# Patient Record
Sex: Female | Born: 1960 | Race: White | Hispanic: No | Marital: Married | Smoking: Current some day smoker
Health system: Southern US, Community
[De-identification: ages and names within clinical notes are randomized; demographics above are authoritative.]

## PROBLEM LIST (undated history)

## (undated) DIAGNOSIS — K219 Gastro-esophageal reflux disease without esophagitis: Secondary | ICD-10-CM

## (undated) DIAGNOSIS — M1990 Unspecified osteoarthritis, unspecified site: Secondary | ICD-10-CM

## (undated) DIAGNOSIS — I10 Essential (primary) hypertension: Secondary | ICD-10-CM

## (undated) DIAGNOSIS — F419 Anxiety disorder, unspecified: Secondary | ICD-10-CM

## (undated) HISTORY — PX: URETHRAL DILATION: SUR417

---

## 2002-03-27 ENCOUNTER — Ambulatory Visit (HOSPITAL_COMMUNITY): Admission: RE | Admit: 2002-03-27 | Discharge: 2002-03-27 | Payer: Self-pay | Admitting: Family Medicine

## 2004-02-21 ENCOUNTER — Ambulatory Visit (HOSPITAL_COMMUNITY): Admission: RE | Admit: 2004-02-21 | Discharge: 2004-02-21 | Payer: Self-pay | Admitting: Obstetrics and Gynecology

## 2005-02-11 ENCOUNTER — Encounter: Admission: RE | Admit: 2005-02-11 | Discharge: 2005-03-19 | Payer: Self-pay | Admitting: *Deleted

## 2005-05-11 ENCOUNTER — Ambulatory Visit (HOSPITAL_COMMUNITY): Admission: RE | Admit: 2005-05-11 | Discharge: 2005-05-11 | Payer: Self-pay | Admitting: *Deleted

## 2006-08-23 ENCOUNTER — Ambulatory Visit (HOSPITAL_COMMUNITY): Admission: RE | Admit: 2006-08-23 | Discharge: 2006-08-23 | Payer: Self-pay | Admitting: Family Medicine

## 2012-10-27 ENCOUNTER — Emergency Department (HOSPITAL_COMMUNITY): Payer: Self-pay

## 2012-10-27 ENCOUNTER — Emergency Department (HOSPITAL_COMMUNITY)
Admission: EM | Admit: 2012-10-27 | Discharge: 2012-10-27 | Disposition: A | Discharge status: Home / Self Care | Payer: Self-pay | Attending: Emergency Medicine | Admitting: Emergency Medicine

## 2012-10-27 ENCOUNTER — Encounter (HOSPITAL_COMMUNITY): Payer: Self-pay

## 2012-10-27 DIAGNOSIS — F17200 Nicotine dependence, unspecified, uncomplicated: Secondary | ICD-10-CM | POA: Insufficient documentation

## 2012-10-27 DIAGNOSIS — I10 Essential (primary) hypertension: Secondary | ICD-10-CM | POA: Insufficient documentation

## 2012-10-27 DIAGNOSIS — M7989 Other specified soft tissue disorders: Secondary | ICD-10-CM | POA: Insufficient documentation

## 2012-10-27 DIAGNOSIS — L02419 Cutaneous abscess of limb, unspecified: Secondary | ICD-10-CM | POA: Insufficient documentation

## 2012-10-27 DIAGNOSIS — Z79899 Other long term (current) drug therapy: Secondary | ICD-10-CM | POA: Insufficient documentation

## 2012-10-27 DIAGNOSIS — L0390 Cellulitis, unspecified: Secondary | ICD-10-CM

## 2012-10-27 DIAGNOSIS — Z87828 Personal history of other (healed) physical injury and trauma: Secondary | ICD-10-CM | POA: Insufficient documentation

## 2012-10-27 HISTORY — DX: Essential (primary) hypertension: I10

## 2012-10-27 MED ORDER — TRIAMCINOLONE ACETONIDE 0.1 % EX CREA: TOPICAL_CREAM | Freq: Two times a day (BID) | CUTANEOUS | Status: DC

## 2012-10-27 MED ORDER — SULFAMETHOXAZOLE-TRIMETHOPRIM 800-160 MG PO TABS: 1.0000 | ORAL_TABLET | Freq: Two times a day (BID) | ORAL | Status: DC

## 2012-10-27 NOTE — ED Notes (Signed)
Pt reports fell 6 weeks ago and hurt left foot.  Pt says since then has had some redness in lower leg.  Pt says now left foot, ankle, and lower leg red and itches.  Pedal pulse present.

## 2012-10-27 NOTE — Progress Notes (Signed)
ED/CM noted patient did not have health insurance and/or PCP listed in the computer.  Patient was given the [place] resource handout with information on the clinics, food pantries, and the handout for new health insurance sign-up.  Patient expressed appreciation for this. 

## 2012-10-27 NOTE — ED Notes (Signed)
Pt reports falling 6 weeks ago and injured left ankle, cont. To have swelling and pain, noted some streaking up her leg several days ago, looked online and she thinks she may have a blood clot. She denies any fever, drainage from the area. Denies any cough or congestion, no sob.

## 2012-10-27 NOTE — ED Provider Notes (Signed)
CSN: [id number]     Arrival date & time 10/27/12  1047 History  This chart was scribed for Caitlin Hutching, MD by Quintella Reichert, ED scribe.  This patient was seen in room APA01/APA01 and the patient's care was started at 12:07 PM.  Chief Complaint  Patient presents with  . Ankle Pain  . Leg Swelling    The history is provided by the patient. No language interpreter was used.    HPI Comments: EZELLA Bond is a 52 y.o. female with h/o HTN who presents to the Emergency Department complaining of left foot injury with subsequent progressively-worsening pain, swelling and redness.  Pt states that she fell 6 weeks ago with impact to the left foot.  She did not break her skin.  Since then she has had some moderate redness and pain to the lower left leg.  She was seen in the UC 2 weeks ago and placed on antibiotics.  She states that since then her redness and pain have persisted and her swelling has grown more severe.  Swelling worsens throughout the day and when she stands on her feet.  Pt also states the area has been hot today.    Past Medical History  Diagnosis Date  . Hypertension     History reviewed. No pertinent past surgical history.   No family history on file.   History  Substance Use Topics  . Smoking status: Current Every Day Smoker  . Smokeless tobacco: Not on file  . Alcohol Use: Yes     Comment: occ    OB History   Grav Para Term Preterm Abortions TAB SAB Ect Mult Living                   Review of Systems A complete 10 system review of systems was obtained and all systems are negative except as noted in the HPI and PMH.    Allergies  Haldol  Home Medications   Current Outpatient Rx  Name  Route  Sig  Dispense  Refill  . busPIRone (BUSPAR) 10 MG tablet   Oral   Take 10 mg by mouth 2 (two) times daily.         . metoprolol succinate (TOPROL-XL) 25 MG 24 hr tablet   Oral   Take 25 mg by mouth daily.          BP 181/85  Pulse 91  Temp(Src) 98  F (36.7 C) (Oral)  Resp 18  Ht 5\' 7"  (1.702 m)  Wt 243 lb (110.224 kg)  BMI 38.05 kg/m2  SpO2 100%  Physical Exam  Nursing note and vitals reviewed. Constitutional: She is oriented to person, place, and time. She appears well-developed and well-nourished.  HENT:  Head: Normocephalic and atraumatic.  Eyes: Conjunctivae and EOM are normal. Pupils are equal, round, and reactive to light.  Neck: Normal range of motion. Neck supple.  Cardiovascular: Normal rate, regular rhythm and normal heart sounds.   Pulmonary/Chest: Effort normal and breath sounds normal.  Abdominal: Soft. Bowel sounds are normal.  Musculoskeletal: Normal range of motion.  Anterior aspect of distal tibia and dorsum of foot slightly erythematous.  Discomfort with flexion of ankle.  Neurological: She is alert and oriented to person, place, and time.  Skin: Skin is warm and dry.  Psychiatric: She has a normal mood and affect.    ED Course  Procedures (including critical care time)  DIAGNOSTIC STUDIES: Oxygen Saturation is 100% on room air, normal by my interpretation.  COORDINATION OF CARE: 12:13 PM-Discussed treatment plan which includes [place] to rule out blood clot and antibiotics if negative with pt at bedside and pt agreed to plan.    Labs Review Labs Reviewed - No data to display  Imaging Review No results found. US Venous Img Lower Unilateral Left  10/27/2012   CLINICAL DATA:  Lower extremity edema  EXAM: VENOUS DUPLEX ULTRASOUND OF LEFT LOWER EXTREMITY  TECHNIQUE: Gray-scale sonography with graded compression, as well as color Doppler and duplex ultrasound, were performed to evaluate the deep venous system from the level of the common femoral vein through the popliteal and proximal calf veins. Spectral Doppler was utilized to evaluate flow at rest and with distal augmentation maneuvers.  COMPARISON:  None.  FINDINGS: Flow in the venous structures of the left lower extremity is spontaneous and phasic in all  segments. There is normal compression and augmentation throughout the venous segments of the left lower extremity. Venous Doppler signal is normal in all regions. There is no thrombus in the deep or visualized superficial venous structures on the left. There is no left lower extremity deep venous incompetence. There is soft tissue edema below the knee.  IMPRESSION: No deep venous thrombosis in the left lower extremity. Edema in the lower leg region.   Electronically Signed   By: Bretta Bang   On: 10/27/2012 13:17   MDM  No diagnosis found. Ultrasound of left lower extremity shows no DVT.    Rx Septra DS twice a day for cellulitis  and Aristocort 0.1% ointment    I personally performed the services described in this documentation, which was scribed in my presence. The recorded information has been reviewed and is accurate.    Caitlin Hutching, MD 10/27/12 1400

## 2012-10-28 NOTE — Care Management ED Note (Signed)
       CARE MANAGEMENT ED NOTE 10/27/2012  Patient:  Caitlin Bond, Caitlin Bond   Account Number:  [id number]  Date Initiated:  10/27/2012  Documentation initiated by:  Anibal Henderson  Subjective/Objective Assessment:     Subjective/Objective Assessment Detail:   ED/CM noted patient did not have health insurance and/or PCP listed in the computer.  Patient was given the [facility] with information on the clinics, food pantries, and the handout for new health insurance sign-up.  Patient expressed appreciation for this.     Action/Plan:   Action/Plan Detail:   Anticipated DC Date:  10/27/2012     Status Recommendation to Physician:   Result of Recommendation:        Choice offered to / List presented to:            Status of service:  Completed, signed off  ED Comments:   ED Comments Detail:

## 2013-06-06 ENCOUNTER — Other Ambulatory Visit (HOSPITAL_COMMUNITY): Payer: Self-pay | Admitting: Family Medicine

## 2013-06-06 DIAGNOSIS — Z139 Encounter for screening, unspecified: Secondary | ICD-10-CM

## 2013-06-15 ENCOUNTER — Ambulatory Visit (HOSPITAL_COMMUNITY): Payer: Self-pay

## 2013-07-04 ENCOUNTER — Ambulatory Visit (HOSPITAL_COMMUNITY)
Admission: RE | Admit: 2013-07-04 | Discharge: 2013-07-04 | Disposition: A | Discharge status: Home / Self Care | Payer: Self-pay | Source: Ambulatory Visit | Attending: Family Medicine | Admitting: Family Medicine

## 2013-07-04 DIAGNOSIS — Z139 Encounter for screening, unspecified: Secondary | ICD-10-CM

## 2013-07-11 ENCOUNTER — Encounter: Payer: Self-pay | Admitting: *Deleted

## 2013-07-17 ENCOUNTER — Telehealth: Payer: Self-pay | Admitting: General Practice

## 2013-07-17 NOTE — Telephone Encounter (Signed)
Message copied by Idamae Schuller on Mon Jul 17, 2013 12:22 PM ------      Message from: SIMS, CHELSEY R      Created: Mon Jul 17, 2013  9:34 AM       Pt called stating she has went through a mess trying to get her [place] forms and hardship settlement application done to help pay for her medical bills, [facility] told her they did not have anything on file for her forms, pt has a appointment 08/14/13 at 11 as new pt with Vicente Males. Pt wants to know if her forms are not ready before this appointment will her assistance forms pay after the visit, pt said she has to pay out of pocket now. Please advise [phone]. I did make pt aware that we do not turn pt's away with no co-pays that billing will just bill her the visit, Pt is concerned if she comes and her forms are not ready will they cover it after the appointment, pt said she had to send bank statements and all kinds of forms for this twice and [facility] does not have it on file so she has to go through the hold process again.  ------

## 2013-07-17 NOTE — Telephone Encounter (Signed)
I spoke with the patient and made her aware that she needed to bring in $25 on the day of her ov.  She will also bring in the appropriate paperwork to complete her financial assistance application.

## 2013-08-14 ENCOUNTER — Encounter (INDEPENDENT_AMBULATORY_CARE_PROVIDER_SITE_OTHER): Payer: Self-pay

## 2013-08-14 ENCOUNTER — Encounter: Payer: Self-pay | Admitting: Gastroenterology

## 2013-08-14 ENCOUNTER — Ambulatory Visit (INDEPENDENT_AMBULATORY_CARE_PROVIDER_SITE_OTHER): Payer: Self-pay | Admitting: Gastroenterology

## 2013-08-14 VITALS — BP 130/85 | HR 70 | Temp 98.0°F | Resp 18 | Ht 67.0 in | Wt 246.0 lb

## 2013-08-14 DIAGNOSIS — R195 Other fecal abnormalities: Secondary | ICD-10-CM

## 2013-08-14 NOTE — Assessment & Plan Note (Signed)
53 year old female with heme positive stool but no concerning lower or upper GI symptoms. No FH of colorectal cancer. No prior colonoscopy; need initial screening colonoscopy. Source of heme positive stool likely benign in nature.  Proceed with TCS with Dr. Gala Romney in near future: the risks, benefits, and alternatives have been discussed with the patient in detail. The patient states understanding and desires to proceed.

## 2013-08-14 NOTE — Patient Instructions (Signed)
We have set you up for a colonoscopy with Dr. Gala Romney in the near future.  Further recommendations to follow!

## 2013-08-14 NOTE — Progress Notes (Signed)
      Primary Care Physician:  Yevette Edwards, NP Primary Gastroenterologist:  Dr. Gala Romney   Chief Complaint  Patient presents with  . Referral    HPI:   Caitlin Bond presents today at the request of Yevette Edwards, NP, secondary to heme positive stool. No prior colonoscopy. No evidence of hematochezia. No melena.  BM every morning. No abdominal pain. No N/V. Occasional reflux controlled by OTC agents. No FH of colon cancer. Outside labs in April 2015 with normal Hgb/Hct.   Past Medical History  Diagnosis Date  . Hypertension     Past Surgical History  Procedure Laterality Date  . Urethral dilation      as child    Current Outpatient Prescriptions  Medication Sig Dispense Refill  . losartan-hydrochlorothiazide (HYZAAR) 50-12.5 MG per tablet Take 1 tablet by mouth daily.      . metoprolol succinate (TOPROL-XL) 25 MG 24 hr tablet Take 25 mg by mouth daily.       No current facility-administered medications for this visit.    Allergies as of 08/14/2013 - Review Complete 08/14/2013  Allergen Reaction Noted  . Haldol [haloperidol lactate]  10/27/2012    Family History  Problem Relation Age of Onset  . Colon cancer Neg Hx     History   Social History  . Marital Status: Married    Spouse Name: N/A    Number of Children: N/A  . Years of Education: N/A   Occupational History  . Not on file.   Social History Main Topics  . Smoking status: Current Every Day Smoker -- 0.75 packs/day    Types: Cigarettes  . Smokeless tobacco: Not on file  . Alcohol Use: Yes     Comment: occ  . Drug Use: No  . Sexual Activity: Not on file   Other Topics Concern  . Not on file   Social History Narrative  . No narrative on file    Review of Systems: Gen: see HPI CV: Denies chest pain, heart palpitations, peripheral edema, syncope.  Resp: Denies shortness of breath at rest or with exertion. Denies wheezing or cough.  GI: see HPI GU : Denies urinary burning, urinary frequency,  urinary hesitancy MS: right knee pain  Derm: Denies rash, itching, dry skin Psych: Denies depression, anxiety, memory loss, and confusion Heme: Denies bruising, bleeding, and enlarged lymph nodes.  Physical Exam: BP 130/85  Pulse 70  Temp(Src) 98 F (36.7 C) (Oral)  Resp 18  Ht 5\' 7"  (1.702 m)  Wt 246 lb (111.585 kg)  BMI 38.52 kg/m2 General:   Alert and oriented. Pleasant and cooperative. Well-nourished and well-developed.  Head:  Normocephalic and atraumatic. Eyes:  Without icterus, sclera clear and conjunctiva pink.  Ears:  Normal auditory acuity. Nose:  No deformity, discharge,  or lesions. Mouth:  No deformity or lesions, oral mucosa pink.  Neck:  Supple, without mass or thyromegaly. Lungs:  Clear to auscultation bilaterally. No wheezes, rales, or rhonchi. No distress.  Heart:  S1, S2 present without murmurs appreciated.  Abdomen:  +BS, soft, non-tender and non-distended. No HSM noted. No guarding or rebound. No masses appreciated.  Rectal:  Deferred  Msk:  Symmetrical without gross deformities. Normal posture. Extremities:  Without clubbing or edema. Neurologic:  Alert and  oriented x4;  grossly normal neurologically. Skin:  Intact without significant lesions or rashes. Cervical Nodes:  No significant cervical adenopathy. Psych:  Alert and cooperative. Normal mood and affect.

## 2013-08-15 NOTE — Progress Notes (Signed)
cc'd to pcp 

## 2013-08-24 ENCOUNTER — Encounter (HOSPITAL_COMMUNITY): Payer: Self-pay | Admitting: Pharmacy Technician

## 2013-09-12 ENCOUNTER — Encounter (HOSPITAL_COMMUNITY)
Admission: RE | Disposition: A | Discharge status: Home / Self Care | Payer: Self-pay | Source: Ambulatory Visit | Attending: Internal Medicine

## 2013-09-12 ENCOUNTER — Encounter (HOSPITAL_COMMUNITY): Payer: Self-pay | Admitting: *Deleted

## 2013-09-12 ENCOUNTER — Ambulatory Visit (HOSPITAL_COMMUNITY)
Admission: RE | Admit: 2013-09-12 | Discharge: 2013-09-12 | Disposition: A | Discharge status: Home / Self Care | Payer: Self-pay | Source: Ambulatory Visit | Attending: Internal Medicine | Admitting: Internal Medicine

## 2013-09-12 DIAGNOSIS — Z86010 Personal history of colonic polyps: Secondary | ICD-10-CM

## 2013-09-12 DIAGNOSIS — D129 Benign neoplasm of anus and anal canal: Secondary | ICD-10-CM

## 2013-09-12 DIAGNOSIS — F17200 Nicotine dependence, unspecified, uncomplicated: Secondary | ICD-10-CM | POA: Insufficient documentation

## 2013-09-12 DIAGNOSIS — I10 Essential (primary) hypertension: Secondary | ICD-10-CM | POA: Insufficient documentation

## 2013-09-12 DIAGNOSIS — D126 Benign neoplasm of colon, unspecified: Secondary | ICD-10-CM | POA: Insufficient documentation

## 2013-09-12 DIAGNOSIS — R195 Other fecal abnormalities: Secondary | ICD-10-CM | POA: Insufficient documentation

## 2013-09-12 DIAGNOSIS — D128 Benign neoplasm of rectum: Secondary | ICD-10-CM | POA: Insufficient documentation

## 2013-09-12 HISTORY — PX: COLONOSCOPY: SHX5424

## 2013-09-12 SURGERY — COLONOSCOPY
Anesthesia: Moderate Sedation

## 2013-09-12 MED ORDER — MEPERIDINE HCL 100 MG/ML IJ SOLN
INTRAMUSCULAR | Status: AC
  Filled 2013-09-12: qty 2

## 2013-09-12 MED ORDER — MIDAZOLAM HCL 5 MG/5ML IJ SOLN
INTRAMUSCULAR | Status: DC | PRN
  Administered 2013-09-12 (×2): 2 mg via INTRAVENOUS
  Administered 2013-09-12: 1 mg via INTRAVENOUS
  Administered 2013-09-12 (×2): 2 mg via INTRAVENOUS

## 2013-09-12 MED ORDER — SODIUM CHLORIDE 0.9 % IV SOLN
INTRAVENOUS | Status: DC
  Administered 2013-09-12: 09:00:00 via INTRAVENOUS

## 2013-09-12 MED ORDER — ONDANSETRON HCL 4 MG/2ML IJ SOLN
INTRAMUSCULAR | Status: DC | PRN
  Administered 2013-09-12: 4 mg via INTRAVENOUS

## 2013-09-12 MED ORDER — ONDANSETRON HCL 4 MG/2ML IJ SOLN
INTRAMUSCULAR | Status: AC
  Filled 2013-09-12: qty 2

## 2013-09-12 MED ORDER — MIDAZOLAM HCL 5 MG/5ML IJ SOLN
INTRAMUSCULAR | Status: AC
  Filled 2013-09-12: qty 10

## 2013-09-12 MED ORDER — MEPERIDINE HCL 100 MG/ML IJ SOLN
INTRAMUSCULAR | Status: DC | PRN
  Administered 2013-09-12 (×3): 50 mg via INTRAVENOUS
  Administered 2013-09-12 (×2): 25 mg via INTRAVENOUS

## 2013-09-12 MED ORDER — STERILE WATER FOR IRRIGATION IR SOLN
Status: DC | PRN
  Administered 2013-09-12: 10:00:00

## 2013-09-12 NOTE — Interval H&P Note (Signed)
History and Physical Interval Note:  09/12/2013 10:02 AM  Caitlin Bond  has presented today for surgery, with the diagnosis of HEME POSITIVE STOOL  The various methods of treatment have been discussed with the patient and family. After consideration of risks, benefits and other options for treatment, the patient has consented to  Procedure(s) with comments: COLONOSCOPY (N/A) - 9:45 as a surgical intervention .  The patient's history has been reviewed, patient examined, no change in status, stable for surgery.  I have reviewed the patient's chart and labs.  Questions were answered to the patient's satisfaction.     Nikos Anglemyer  No change. Colonoscopy per plan.The risks, benefits, limitations, alternatives and imponderables have been reviewed with the patient. Questions have been answered. All parties are agreeable.

## 2013-09-12 NOTE — H&P (View-Only) (Signed)
      Primary Care Physician:  Yevette Edwards, NP Primary Gastroenterologist:  Dr. Gala Romney   Chief Complaint  Patient presents with  . Referral    HPI:   Caitlin Bond presents today at the request of Yevette Edwards, NP, secondary to heme positive stool. No prior colonoscopy. No evidence of hematochezia. No melena.  BM every morning. No abdominal pain. No N/V. Occasional reflux controlled by OTC agents. No FH of colon cancer. Outside labs in April 2015 with normal Hgb/Hct.   Past Medical History  Diagnosis Date  . Hypertension     Past Surgical History  Procedure Laterality Date  . Urethral dilation      as child    Current Outpatient Prescriptions  Medication Sig Dispense Refill  . losartan-hydrochlorothiazide (HYZAAR) 50-12.5 MG per tablet Take 1 tablet by mouth daily.      . metoprolol succinate (TOPROL-XL) 25 MG 24 hr tablet Take 25 mg by mouth daily.       No current facility-administered medications for this visit.    Allergies as of 08/14/2013 - Review Complete 08/14/2013  Allergen Reaction Noted  . Haldol [haloperidol lactate]  10/27/2012    Family History  Problem Relation Age of Onset  . Colon cancer Neg Hx     History   Social History  . Marital Status: Married    Spouse Name: N/A    Number of Children: N/A  . Years of Education: N/A   Occupational History  . Not on file.   Social History Main Topics  . Smoking status: Current Every Day Smoker -- 0.75 packs/day    Types: Cigarettes  . Smokeless tobacco: Not on file  . Alcohol Use: Yes     Comment: occ  . Drug Use: No  . Sexual Activity: Not on file   Other Topics Concern  . Not on file   Social History Narrative  . No narrative on file    Review of Systems: Gen: see HPI CV: Denies chest pain, heart palpitations, peripheral edema, syncope.  Resp: Denies shortness of breath at rest or with exertion. Denies wheezing or cough.  GI: see HPI GU : Denies urinary burning, urinary frequency,  urinary hesitancy MS: right knee pain  Derm: Denies rash, itching, dry skin Psych: Denies depression, anxiety, memory loss, and confusion Heme: Denies bruising, bleeding, and enlarged lymph nodes.  Physical Exam: BP 130/85  Pulse 70  Temp(Src) 98 F (36.7 C) (Oral)  Resp 18  Ht 5\' 7"  (1.702 m)  Wt 246 lb (111.585 kg)  BMI 38.52 kg/m2 General:   Alert and oriented. Pleasant and cooperative. Well-nourished and well-developed.  Head:  Normocephalic and atraumatic. Eyes:  Without icterus, sclera clear and conjunctiva pink.  Ears:  Normal auditory acuity. Nose:  No deformity, discharge,  or lesions. Mouth:  No deformity or lesions, oral mucosa pink.  Neck:  Supple, without mass or thyromegaly. Lungs:  Clear to auscultation bilaterally. No wheezes, rales, or rhonchi. No distress.  Heart:  S1, S2 present without murmurs appreciated.  Abdomen:  +BS, soft, non-tender and non-distended. No HSM noted. No guarding or rebound. No masses appreciated.  Rectal:  Deferred  Msk:  Symmetrical without gross deformities. Normal posture. Extremities:  Without clubbing or edema. Neurologic:  Alert and  oriented x4;  grossly normal neurologically. Skin:  Intact without significant lesions or rashes. Cervical Nodes:  No significant cervical adenopathy. Psych:  Alert and cooperative. Normal mood and affect.

## 2013-09-12 NOTE — Op Note (Signed)
[facility] [address] [place], [address]   COLONOSCOPY PROCEDURE REPORT  PATIENT: [name], [name]  MR#:         [id number] BIRTHDATE: 15-Nov-1960 , 56  yrs. old GENDER: Female ENDOSCOPIST: R.  Garfield Cornea, MD FACP FACG REFERRED BY:  Yevette Edwards, NP PROCEDURE DATE:  09/12/2013 PROCEDURE:     Ileocolonoscopy with snare polypectomies  INDICATIONS: occult blood positive stool  INFORMED CONSENT:  The risks, benefits, alternatives and imponderables including but not limited to bleeding, perforation as well as the possibility of a missed lesion have been reviewed.  The potential for biopsy, lesion removal, etc. have also been discussed.  Questions have been answered.  All parties agreeable. Please see the history and physical in the medical record for more information.  MEDICATIONS: Versed 9 mg IV and Demerol 200 mg IV in divided doses. Zofran 4 mg IV.  DESCRIPTION OF PROCEDURE:  After a digital rectal exam was performed, the EC-[id number] ([id number])  colonoscope was advanced from the anus through the rectum and colon to the area of the cecum, ileocecal valve and appendiceal orifice.  The cecum was deeply intubated.  These structures were well-seen and photographed for the record.  From the level of the cecum and ileocecal valve, the scope was slowly and cautiously withdrawn.  The mucosal surfaces were carefully surveyed utilizing scope tip deflection to facilitate fold flattening as needed.  The scope was pulled down into the rectum where a thorough examination including retroflexion was performed.    FINDINGS:  Adequate preparation. Normal rectum. (1) 6 mm polyp in the mid sigmoid segment and (1) 4 mm polyp at the splenic flexure; otherwise, the remainder of the colonic mucosa appeared normal. The distal 5 cm of terminal ileal mucosa also appeared normal.  THERAPEUTIC / DIAGNOSTIC MANEUVERS PERFORMED:  The above-mentioned polyps were hot and cold snare  removed, respectively.  COMPLICATIONS: none  CECAL WITHDRAWAL TIME:  9 minutes  IMPRESSION:  Colonic polyps-removed as described above.  RECOMMENDATIONS: Followup on pathology.   _______________________________ eSigned:  R. Garfield Cornea, MD FACP [facility] 09/12/2013 10:52 AM   CC:    PATIENT NAME:  Caitlin Bond, Caitlin Bond MR#: [id number]

## 2013-09-12 NOTE — Discharge Instructions (Addendum)
°Colonoscopy °Discharge Instructions ° °Read the instructions outlined below and refer to this sheet in the next few weeks. These discharge instructions provide you with general information on caring for yourself after you leave the hospital. Your doctor may also give you specific instructions. While your treatment has been planned according to the most current medical practices available, unavoidable complications occasionally occur. If you have any problems or questions after discharge, call Dr. Rourk at [phone]. °ACTIVITY °· You may resume your regular activity, but move at a slower pace for the next 24 hours.  °· Take frequent rest periods for the next 24 hours.  °· Walking will help get rid of the air and reduce the bloated feeling in your belly (abdomen).  °· No driving for 24 hours (because of the medicine (anesthesia) used during the test).   °· Do not sign any important legal documents or operate any machinery for 24 hours (because of the anesthesia used during the test).  °NUTRITION °· Drink plenty of fluids.  °· You may resume your normal diet as instructed by your doctor.  °· Begin with a light meal and progress to your normal diet. Heavy or fried foods are harder to digest and may make you feel sick to your stomach (nauseated).  °· Avoid alcoholic beverages for 24 hours or as instructed.  °MEDICATIONS °· You may resume your normal medications unless your doctor tells you otherwise.  °WHAT YOU CAN EXPECT TODAY °· Some feelings of bloating in the abdomen.  °· Passage of more gas than usual.  °· Spotting of blood in your stool or on the toilet paper.  °IF YOU HAD POLYPS REMOVED DURING THE COLONOSCOPY: °· No aspirin products for 7 days or as instructed.  °· No alcohol for 7 days or as instructed.  °· Eat a soft diet for the next 24 hours.  °FINDING OUT THE RESULTS OF YOUR TEST °Not all test results are available during your visit. If your test results are not back during the visit, make an appointment  with your caregiver to find out the results. Do not assume everything is normal if you have not heard from your caregiver or the medical facility. It is important for you to follow up on all of your test results.  °SEEK IMMEDIATE MEDICAL ATTENTION IF: °· You have more than a spotting of blood in your stool.  °· Your belly is swollen (abdominal distention).  °· You are nauseated or vomiting.  °· You have a temperature over 101.  °· You have abdominal pain or discomfort that is severe or gets worse throughout the day.  ° ° °Polyp information provided ° °Further recommendations to follow pending review of pathology report ° °Colon Polyps °Polyps are lumps of extra tissue growing inside the body. Polyps can grow in the large intestine (colon). Most colon polyps are noncancerous (benign). However, some colon polyps can become cancerous over time. Polyps that are larger than a pea may be harmful. To be safe, caregivers remove and test all polyps. °CAUSES  °Polyps form when mutations in the genes cause your cells to grow and divide even though no more tissue is needed. °RISK FACTORS °There are a number of risk factors that can increase your chances of getting colon polyps. They include: °· Being older than 50 years. °· Family history of colon polyps or colon cancer. °· Long-term colon diseases, such as colitis or Crohn disease. °· Being overweight. °· Smoking. °· Being inactive. °· Drinking too much alcohol. °SYMPTOMS  °  Most small polyps do not cause symptoms. If symptoms are present, they may include: °· Blood in the stool. The stool may look dark red or black. °· Constipation or diarrhea that lasts longer than 1 week. °DIAGNOSIS °People often do not know they have polyps until their caregiver finds them during a regular checkup. Your caregiver can use 4 tests to check for polyps: °· Digital rectal exam. The caregiver wears gloves and feels inside the rectum. This test would find polyps only in the rectum. °· Barium  enema. The caregiver puts a liquid called barium into your rectum before taking X-rays of your colon. Barium makes your colon look white. Polyps are dark, so they are easy to see in the X-ray pictures. °· Sigmoidoscopy. A thin, flexible tube (sigmoidoscope) is placed into your rectum. The sigmoidoscope has a light and tiny camera in it. The caregiver uses the sigmoidoscope to look at the last third of your colon. °· Colonoscopy. This test is like sigmoidoscopy, but the caregiver looks at the entire colon. This is the most common method for finding and removing polyps. °TREATMENT  °Any polyps will be removed during a sigmoidoscopy or colonoscopy. The polyps are then tested for cancer. °PREVENTION  °To help lower your risk of getting more colon polyps: °· Eat plenty of fruits and vegetables. Avoid eating fatty foods. °· Do not smoke. °· Avoid drinking alcohol. °· Exercise every day. °· Lose weight if recommended by your caregiver. °· Eat plenty of calcium and folate. Foods that are rich in calcium include milk, cheese, and broccoli. Foods that are rich in folate include chickpeas, kidney beans, and spinach. °HOME CARE INSTRUCTIONS °Keep all follow-up appointments as directed by your caregiver. You may need periodic exams to check for polyps. °SEEK MEDICAL CARE IF: °You notice bleeding during a bowel movement. °Document Released: 10/16/2003 Document Revised: 04/13/2011 Document Reviewed: 03/31/2011 °[employer]® Patient Information ©2015 [employer]. This information is not intended to replace advice given to you by your health care provider. Make sure you discuss any questions you have with your health care provider. ° °

## 2013-09-15 ENCOUNTER — Encounter: Payer: Self-pay | Admitting: Internal Medicine

## 2013-09-18 ENCOUNTER — Encounter (HOSPITAL_COMMUNITY): Payer: Self-pay | Admitting: Internal Medicine

## 2013-11-21 ENCOUNTER — Encounter: Payer: Self-pay | Admitting: Orthopedic Surgery

## 2013-11-21 ENCOUNTER — Ambulatory Visit (INDEPENDENT_AMBULATORY_CARE_PROVIDER_SITE_OTHER): Payer: Self-pay | Admitting: Orthopedic Surgery

## 2013-11-21 VITALS — BP 125/81 | Ht 67.0 in | Wt 246.0 lb

## 2013-11-21 DIAGNOSIS — M1711 Unilateral primary osteoarthritis, right knee: Secondary | ICD-10-CM | POA: Insufficient documentation

## 2013-11-21 DIAGNOSIS — M23321 Other meniscus derangements, posterior horn of medial meniscus, right knee: Secondary | ICD-10-CM

## 2013-11-21 DIAGNOSIS — M23329 Other meniscus derangements, posterior horn of medial meniscus, unspecified knee: Secondary | ICD-10-CM | POA: Insufficient documentation

## 2013-11-21 DIAGNOSIS — S83206A Unspecified tear of unspecified meniscus, current injury, right knee, initial encounter: Secondary | ICD-10-CM | POA: Insufficient documentation

## 2013-11-21 NOTE — Patient Instructions (Addendum)
We will schedule MRI for you   Smoking Cessation, Tips for Success If you are ready to quit smoking, congratulations! You have chosen to help yourself be healthier. Cigarettes bring nicotine, tar, carbon monoxide, and other irritants into your body. Your lungs, heart, and blood vessels will be able to work better without these poisons. There are many different ways to quit smoking. Nicotine gum, nicotine patches, a nicotine inhaler, or nicotine nasal spray can help with physical craving. Hypnosis, support groups, and medicines help break the habit of smoking. WHAT THINGS CAN I DO TO MAKE QUITTING EASIER?  Here are some tips to help you quit for good:  Pick a date when you will quit smoking completely. Tell all of your friends and family about your plan to quit on that date.  Do not try to slowly cut down on the number of cigarettes you are smoking. Pick a quit date and quit smoking completely starting on that day.  Throw away all cigarettes.   Clean and remove all ashtrays from your home, work, and car.  On a card, write down your reasons for quitting. Carry the card with you and read it when you get the urge to smoke.  Cleanse your body of nicotine. Drink enough water and fluids to keep your urine clear or pale yellow. Do this after quitting to flush the nicotine from your body.  Learn to predict your moods. Do not let a bad situation be your excuse to have a cigarette. Some situations in your life might tempt you into wanting a cigarette.  Never have "just one" cigarette. It leads to wanting another and another. Remind yourself of your decision to quit.  Change habits associated with smoking. If you smoked while driving or when feeling stressed, try other activities to replace smoking. Stand up when drinking your coffee. Brush your teeth after eating. Sit in a different chair when you read the paper. Avoid alcohol while trying to quit, and try to drink fewer caffeinated beverages. Alcohol  and caffeine may urge you to smoke.  Avoid foods and drinks that can trigger a desire to smoke, such as sugary or spicy foods and alcohol.  Ask people who smoke not to smoke around you.  Have something planned to do right after eating or having a cup of coffee. For example, plan to take a walk or exercise.  Try a relaxation exercise to calm you down and decrease your stress. Remember, you may be tense and nervous for the first 2 weeks after you quit, but this will pass.  Find new activities to keep your hands busy. Play with a pen, coin, or rubber band. Doodle or draw things on paper.  Brush your teeth right after eating. This will help cut down on the craving for the taste of tobacco after meals. You can also try mouthwash.   Use oral substitutes in place of cigarettes. Try using lemon drops, carrots, cinnamon sticks, or chewing gum. Keep them handy so they are available when you have the urge to smoke.  When you have the urge to smoke, try deep breathing.  Designate your home as a nonsmoking area.  If you are a heavy smoker, ask your health care provider about a prescription for nicotine chewing gum. It can ease your withdrawal from nicotine.  Reward yourself. Set aside the cigarette money you save and buy yourself something nice.  Look for support from others. Join a support group or smoking cessation program. Ask someone at home or  at work to help you with your plan to quit smoking.  Always ask yourself, "Do I need this cigarette or is this just a reflex?" Tell yourself, "Today, I choose not to smoke," or "I do not want to smoke." You are reminding yourself of your decision to quit.  Do not replace cigarette smoking with electronic cigarettes (commonly called e-cigarettes). The safety of e-cigarettes is unknown, and some may contain harmful chemicals.  If you relapse, do not give up! Plan ahead and think about what you will do the next time you get the urge to smoke. HOW WILL I  FEEL WHEN I QUIT SMOKING? You may have symptoms of withdrawal because your body is used to nicotine (the addictive substance in cigarettes). You may crave cigarettes, be irritable, feel very hungry, cough often, get headaches, or have difficulty concentrating. The withdrawal symptoms are only temporary. They are strongest when you first quit but will go away within 10-14 days. When withdrawal symptoms occur, stay in control. Think about your reasons for quitting. Remind yourself that these are signs that your body is healing and getting used to being without cigarettes. Remember that withdrawal symptoms are easier to treat than the major diseases that smoking can cause.  Even after the withdrawal is over, expect periodic urges to smoke. However, these cravings are generally short lived and will go away whether you smoke or not. Do not smoke! WHAT RESOURCES ARE AVAILABLE TO HELP ME QUIT SMOKING? Your health care provider can direct you to community resources or hospitals for support, which may include:  Group support.  Education.  Hypnosis.  Therapy. Document Released: 10/18/2003 Document Revised: 06/05/2013 Document Reviewed: 07/07/2012 [facility] Patient Information 2015 [place], [place]. This information is not intended to replace advice given to you by your health care provider. Make sure you discuss any questions you have with your health care provider.

## 2013-11-21 NOTE — Progress Notes (Signed)
Patient ID: Caitlin Bond, female   DOB: 1960-11-15, 53 y.o.   MRN: [id number] Chief Complaint  Patient presents with  . Knee Pain    Right knee pain x 1 year, no known injury    53 year old female previously evaluated and didn't vary [place] for difficulties she was having with her right knee. She was given an intra-articular injection of cortisone injected pain relief but still complains of pain swelling stiffness and inability to fully extend the knee with giving out symptoms. She indicates that she's had difficulties with the knee even as a child with the knee giving out and going out of place. She complains of constant 6/10 pain. She had x-rays my interpretation of those x-rays is that she has fairly severe degenerative arthritis especially for her age of 68. She was also treated with ibuprofen and ice but she had a bleeding polyp and has since had that removed. She has difficulty standing walking getting up after sitting lying down and driving.   Review of Systems  Constitutional: Positive for malaise/fatigue.  Gastrointestinal: Positive for blood in stool.  Musculoskeletal: Positive for back pain and joint pain.  Skin: Positive for rash.  All other systems reviewed and are negative.  Past Medical History  Diagnosis Date  . Hypertension    Past Surgical History  Procedure Laterality Date  . Urethral dilation      as child  . Colonoscopy N/A 09/12/2013    Procedure: COLONOSCOPY;  Surgeon: Daneil Dolin, MD;  Location[facility];  Service: Endoscopy;  Laterality: N/A;  9:45   Family History  Problem Relation Age of Onset  . Colon cancer Neg Hx    History  Substance Use Topics  . Smoking status: Current Every Day Smoker -- 0.50 packs/day    Types: Cigarettes  . Smokeless tobacco: Not on file  . Alcohol Use: Yes     Comment: 5 per week    BP 125/81  Ht 5\' 7"  (1.702 m)  Wt 246 lb (111.585 kg)  BMI 38.52 kg/m2  Gen. appearance development nutrition body habitus  normal she has some dental deficiencies. Peripheral pulses are intact extremities are warm to touch there is no edema no swelling paragraph groin lymph nodes are negative paragraph ambulation is normal without assistive device.  Left knee no tenderness swelling or crepitation, full range of motion, ligaments stable. Strength normal. Muscle tone normal.  Right knee medial and lateral joint line tenderness no effusion. Flexion 120 extension she lacks 5. Ligament stable. Muscle strength and tone normal. Meniscal tests were equivocal.  Skin all 4 extremities normal. Upper extremities normal. Fine motor coordination normal. Reflexes 2+ normal. Sensory exam soft touch proprioception normal. Orientation times person place normal. Mood affect normal. Paragraph she brought an x-ray with her on a disc it shows moderate to severe joint space narrowing on both sides of the joint indicating severe arthritis especially when you account for her age of 25  Diagnosis osteoarthritis right knee Possible meniscal tear accounting for lack of extension  Recommend MRI right knee

## 2013-12-05 ENCOUNTER — Ambulatory Visit: Payer: Self-pay | Admitting: Orthopedic Surgery

## 2013-12-06 ENCOUNTER — Ambulatory Visit (HOSPITAL_COMMUNITY)
Admission: RE | Admit: 2013-12-06 | Discharge: 2013-12-06 | Disposition: A | Discharge status: Home / Self Care | Payer: Self-pay | Source: Ambulatory Visit | Attending: Orthopedic Surgery | Admitting: Orthopedic Surgery

## 2013-12-06 DIAGNOSIS — M25561 Pain in right knee: Secondary | ICD-10-CM | POA: Insufficient documentation

## 2013-12-06 DIAGNOSIS — M25661 Stiffness of right knee, not elsewhere classified: Secondary | ICD-10-CM | POA: Insufficient documentation

## 2013-12-06 DIAGNOSIS — M23321 Other meniscus derangements, posterior horn of medial meniscus, right knee: Secondary | ICD-10-CM

## 2013-12-06 DIAGNOSIS — X58XXXA Exposure to other specified factors, initial encounter: Secondary | ICD-10-CM | POA: Insufficient documentation

## 2013-12-06 DIAGNOSIS — S83241A Other tear of medial meniscus, current injury, right knee, initial encounter: Secondary | ICD-10-CM | POA: Insufficient documentation

## 2013-12-07 ENCOUNTER — Ambulatory Visit: Payer: Self-pay | Admitting: Orthopedic Surgery

## 2013-12-07 ENCOUNTER — Encounter: Payer: Self-pay | Admitting: Orthopedic Surgery

## 2013-12-21 ENCOUNTER — Ambulatory Visit (INDEPENDENT_AMBULATORY_CARE_PROVIDER_SITE_OTHER): Payer: Self-pay | Admitting: Orthopedic Surgery

## 2013-12-21 ENCOUNTER — Encounter: Payer: Self-pay | Admitting: Orthopedic Surgery

## 2013-12-21 DIAGNOSIS — M1711 Unilateral primary osteoarthritis, right knee: Secondary | ICD-10-CM

## 2013-12-21 DIAGNOSIS — M129 Arthropathy, unspecified: Secondary | ICD-10-CM

## 2013-12-21 DIAGNOSIS — M23321 Other meniscus derangements, posterior horn of medial meniscus, right knee: Secondary | ICD-10-CM

## 2013-12-21 NOTE — Patient Instructions (Addendum)
Call office to discuss surgery date   Arthroscopic Procedure, Knee An arthroscopic procedure can find what is wrong with your knee. PROCEDURE Arthroscopy is a surgical technique that allows your orthopedic surgeon to diagnose and treat your knee injury with accuracy. They will look into your knee through a small instrument. This is almost like a small (pencil sized) telescope. Because arthroscopy affects your knee less than open knee surgery, you can anticipate a more rapid recovery. Taking an active role by following your caregiver's instructions will help with rapid and complete recovery. Use crutches, rest, elevation, ice, and knee exercises as instructed. The length of recovery depends on various factors including type of injury, age, physical condition, medical conditions, and your rehabilitation. Your knee is the joint between the large bones (femur and tibia) in your leg. Cartilage covers these bone ends which are smooth and slippery and allow your knee to bend and move smoothly. Two menisci, thick, semi-lunar shaped pads of cartilage which form a rim inside the joint, help absorb shock and stabilize your knee. Ligaments bind the bones together and support your knee joint. Muscles move the joint, help support your knee, and take stress off the joint itself. Because of this all programs and physical therapy to rehabilitate an injured or repaired knee require rebuilding and strengthening your muscles. AFTER THE PROCEDURE  After the procedure, you will be moved to a recovery area until most of the effects of the medication have worn off. Your caregiver will discuss the test results with you.  Only take over-the-counter or prescription medicines for pain, discomfort, or fever as directed by your caregiver. SEEK MEDICAL CARE IF:   You have increased bleeding from your wounds.  You see redness, swelling, or have increasing pain in your wounds.  You have pus coming from your wound.  You have an  oral temperature above 102 F (38.9 C).  You notice a bad smell coming from the wound or dressing.  You have severe pain with any motion of your knee. SEEK IMMEDIATE MEDICAL CARE IF:   You develop a rash.  You have difficulty breathing.  You have any allergic problems. Document Released: 01/17/2000 Document Revised: 04/13/2011 Document Reviewed: 08/10/2007 [facility] Patient Information 2015 [place], [place]. This information is not intended to replace advice given to you by your health care provider. Make sure you discuss any questions you have with your health care provider.

## 2013-12-21 NOTE — Progress Notes (Signed)
Patient ID: Caitlin Bond, female   DOB: 1960-03-12, 53 y.o.   MRN: [id number] Chief Complaint  Patient presents with  . Follow-up    MRI right knee follow-up    The patient had complaints of right knee pain frequent giving out episode she had an MRI of her knee on November 4 I reviewed the MRI we'll leave the report in her records and my interpretation is that she has full thickness cartilage loss medial compartment small joint effusion 3 compartment osteophytes degeneration of her lateral meniscus and tear of the medial meniscus.  She still having knee pain mechanical symptoms.  I discussed this with her she is interested in surgery for arthroscopic right knee medial meniscectomy she wants to do it in January pending her [employer] discount extension  All questions were answered  Time spent 15 minutes  IMPRESSION: 1. Complete radial tear of the midbody of the medial meniscus with secondary peripheral subluxation. Severe degeneration and fraying of the posterior horn. 2. Extensive denuding of the articular cartilage in the medial compartment. 3. Intrinsic degeneration of the midbody of the lateral meniscus with a discrete tear.

## 2014-06-06 ENCOUNTER — Encounter (HOSPITAL_COMMUNITY): Payer: Self-pay | Admitting: Emergency Medicine

## 2014-06-06 ENCOUNTER — Emergency Department (HOSPITAL_COMMUNITY)
Admission: EM | Admit: 2014-06-06 | Discharge: 2014-06-06 | Disposition: A | Discharge status: Home / Self Care | Payer: Self-pay | Attending: Emergency Medicine | Admitting: Emergency Medicine

## 2014-06-06 ENCOUNTER — Emergency Department (HOSPITAL_COMMUNITY): Payer: Self-pay

## 2014-06-06 DIAGNOSIS — M5432 Sciatica, left side: Secondary | ICD-10-CM | POA: Insufficient documentation

## 2014-06-06 DIAGNOSIS — I10 Essential (primary) hypertension: Secondary | ICD-10-CM | POA: Insufficient documentation

## 2014-06-06 DIAGNOSIS — Z720 Tobacco use: Secondary | ICD-10-CM | POA: Insufficient documentation

## 2014-06-06 DIAGNOSIS — Z79899 Other long term (current) drug therapy: Secondary | ICD-10-CM | POA: Insufficient documentation

## 2014-06-06 MED ORDER — HYDROCODONE-ACETAMINOPHEN 5-325 MG PO TABS: 1.0000 | ORAL_TABLET | ORAL | Status: DC | PRN

## 2014-06-06 MED ORDER — METHOCARBAMOL 500 MG PO TABS: 500.0000 mg | ORAL_TABLET | Freq: Two times a day (BID) | ORAL | Status: DC

## 2014-06-06 MED ORDER — NAPROXEN 500 MG PO TABS: 500.0000 mg | ORAL_TABLET | Freq: Two times a day (BID) | ORAL | Status: DC

## 2014-06-06 NOTE — ED Notes (Signed)
Pt reports lower back pain and reports saw pcp for same and reports "arthritis meds" are not working. Pt reports lower back pain radiating to left lower hip and leg.

## 2014-06-06 NOTE — Discharge Instructions (Signed)
Do not take the narcotic or the muscle relaxant if driving because they may make you sleepy. Follow up with your doctor for further evaluation of your back and knee problems.

## 2014-06-06 NOTE — ED Provider Notes (Signed)
CSN: [id number]     Arrival date & time 06/06/14  1124 History   First MD Initiated Contact with Patient 06/06/14 1145     Chief Complaint  Patient presents with  . Back Pain     (Consider location/radiation/quality/duration/timing/severity/associated sxs/prior Treatment) Patient is a 54 y.o. female presenting with back pain. The history is provided by the patient.  Back Pain Location:  Lumbar spine Quality:  Shooting Radiates to:  L posterior upper leg Pain severity:  Severe Pain is:  Same all the time Onset quality:  Gradual Duration:  2 months Timing:  Constant Progression:  Worsening Relieved by:  Nothing Worsened by:  Ambulation, movement, bending, standing and twisting Associated symptoms: leg pain   Associated symptoms: no bladder incontinence, no bowel incontinence, no dysuria, no fever, no numbness and no tingling    Caitlin Bond is a 54 y.o. female who presents to the ED with low back pain on the left side that radiates to the left buttock and back of left upper leg. The pain started 2 months ago and she went to her PCP and saw a doctor there that put her on Mobic. That made her sick so she stopped taking it. She continues to have pain and today is when the pain started down her leg. She reports that she needs a knee replacement on the right so she puts more stress on her left side. 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 She went for PT and it got better. She is afraid she may have a ruptured disc in her lower back.  Past Medical History  Diagnosis Date  . Hypertension    Past Surgical History  Procedure Laterality Date  . Urethral dilation      as child  . Colonoscopy N/A 09/12/2013    Procedure: COLONOSCOPY;  Surgeon: Daneil Dolin, MD;  [facility];  Service: Endoscopy;  Laterality: N/A;  9:45   Family History  Problem Relation Age of Onset  . Colon cancer Neg Hx    History  Substance Use Topics  .  Smoking status: Current Some Day Smoker -- 0.10 packs/day    Types: Cigarettes  . Smokeless tobacco: Not on file  . Alcohol Use: Yes     Comment: 5 per week   OB History    No data available     Review of Systems  Constitutional: Negative for fever.  Gastrointestinal: Negative for bowel incontinence.  Genitourinary: Negative for bladder incontinence and dysuria.  Musculoskeletal: Positive for back pain and arthralgias.  Neurological: Negative for tingling and numbness.  all other systems negative    Allergies  Haldol  Home Medications   Prior to Admission medications   Medication Sig Start Date End Date Taking? Authorizing Provider  HYDROcodone-acetaminophen (NORCO/VICODIN) 5-325 MG per tablet Take 1 tablet by mouth every 4 (four) hours as needed. 06/06/14   Carissa Musick Bunnie Pion, NP  losartan-hydrochlorothiazide (HYZAAR) 50-12.5 MG per tablet Take 1 tablet by mouth daily.    Historical Provider, MD  methocarbamol (ROBAXIN) 500 MG tablet Take 1 tablet (500 mg total) by mouth 2 (two) times daily. 06/06/14   Tyrik Stetzer Bunnie Pion, NP  metoprolol succinate (TOPROL-XL) 25 MG 24 hr tablet Take 25 mg by mouth daily.    Historical Provider, MD  naproxen (NAPROSYN) 500 MG tablet Take 1 tablet (500 mg total) by mouth 2 (two) times daily. 06/06/14   Hosanna Betley Bunnie Pion, NP  BP 131/81 mmHg  Pulse 90  Temp(Src) 98.3 F (36.8 C) (Oral)  Resp 16  Ht 5\' 7"  (1.702 m)  Wt 260 lb (117.935 kg)  BMI 40.71 kg/m2  SpO2 98% Physical Exam  Constitutional: She is oriented to person, place, and time. She appears well-developed and well-nourished. No distress.  HENT:  Head: Normocephalic and atraumatic.  Right Ear: Tympanic membrane normal.  Left Ear: Tympanic membrane normal.  Nose: Nose normal.  Mouth/Throat: Uvula is midline, oropharynx is clear and moist and mucous membranes are normal.  Eyes: EOM are normal.  Neck: Normal range of motion. Neck supple.  Cardiovascular: Normal rate and regular rhythm.     Pulmonary/Chest: Effort normal. She has no wheezes. She has no rales.  Abdominal: Soft. Bowel sounds are normal. There is no tenderness.  Musculoskeletal: Normal range of motion.       Lumbar back: She exhibits tenderness and pain. She exhibits normal pulse.       Back:  Neurological: She is alert and oriented to person, place, and time. She has normal strength. No cranial nerve deficit or sensory deficit. Gait normal.  Reflex Scores:      Bicep reflexes are 2+ on the right side and 2+ on the left side.      Brachioradialis reflexes are 2+ on the right side and 2+ on the left side.      Patellar reflexes are 2+ on the right side and 2+ on the left side.      Achilles reflexes are 2+ on the right side and 2+ on the left side. Pedal pulses 2+ bilateral, adequate circulation, good touch sensation. Steady gait without foot drag.  Skin: Skin is warm and dry.  Psychiatric: She has a normal mood and affect. Her behavior is normal.  Nursing note and vitals reviewed.   ED Course  Procedures (including critical care time) Labs Review Labs Reviewed - No data to display  Imaging Review Ct Lumbar Spine Wo Contrast  06/06/2014   CLINICAL DATA:  Low back pain with left leg and hip pain  EXAM: CT LUMBAR SPINE WITHOUT CONTRAST  TECHNIQUE: Multidetector CT imaging of the lumbar spine was performed without intravenous contrast administration. Multiplanar CT image reconstructions were also generated.  COMPARISON:  None.  FINDINGS: Normal lumbar alignment. Negative for fracture or mass lesion. No focal bony lesion.  L1-2: Moderate disc space narrowing with disc bulging and mild spurring. Mild facet hypertrophy without spinal stenosis.  L2-3: Disc bulging and facet degeneration causing mild spinal stenosis.  L3-4: Diffuse disc bulging and moderate facet hypertrophy. Moderate spinal stenosis. No focal disc protrusion. Neural foramina are patent  L4-5: Diffuse disc bulging. Bilateral facet hypertrophy with mild  spinal stenosis. Neural foramina patent  L5-S1: Bilateral facet hypertrophy without spinal or foraminal encroachment.  Mild SI joint degenerative change with spurring  IMPRESSION: Lumbar degenerative change as above. Mild spinal stenosis at L2-3 and L4-5. Moderate spinal stenosis L3-4.   Electronically Signed   By: Franchot Gallo M.D.   On: 06/06/2014 14:08     MDM  54 y.o. female with low back pain that radiates to the left buttock and posterior left thigh. Stable for d/c without focal neuro deficits. Will treat with muscle relaxant, NSAIDS and pain medication. She will follow up with her PCP as planned. She will return here as needed. Discussed with the patient and all questioned fully answered.   Final diagnoses:  Sciatica, left     Ashley Murrain, NP 06/06/14 1429  Ezequiel Essex, MD 06/06/14 [phone]

## 2014-06-11 ENCOUNTER — Other Ambulatory Visit (HOSPITAL_COMMUNITY): Payer: Self-pay | Admitting: Nurse Practitioner

## 2014-06-11 DIAGNOSIS — Z1231 Encounter for screening mammogram for malignant neoplasm of breast: Secondary | ICD-10-CM

## 2014-07-09 ENCOUNTER — Ambulatory Visit (HOSPITAL_COMMUNITY)
Admission: RE | Admit: 2014-07-09 | Discharge: 2014-07-09 | Disposition: A | Discharge status: Home / Self Care | Payer: Self-pay | Source: Ambulatory Visit | Attending: Nurse Practitioner | Admitting: Nurse Practitioner

## 2014-07-09 DIAGNOSIS — Z1231 Encounter for screening mammogram for malignant neoplasm of breast: Secondary | ICD-10-CM

## 2014-11-01 ENCOUNTER — Ambulatory Visit (INDEPENDENT_AMBULATORY_CARE_PROVIDER_SITE_OTHER): Payer: Medicaid Other | Admitting: Orthopedic Surgery

## 2014-11-01 VITALS — BP 119/79 | Ht 67.0 in | Wt 266.2 lb

## 2014-11-01 DIAGNOSIS — M23321 Other meniscus derangements, posterior horn of medial meniscus, right knee: Secondary | ICD-10-CM | POA: Diagnosis not present

## 2014-11-01 NOTE — Patient Instructions (Addendum)
Schedule surgery [address] [facility]   You have decided to proceed with operative arthroscopy of the knee. You have decided not to continue with nonoperative measures such as but not limited to oral medication, weight loss, activity modification, physical therapy, bracing, or injection.  We will perform operative arthroscopy of the knee. Some of the risks associated with arthroscopic surgery of the knee include but are not limited to Bleeding Infection Swelling Stiffness Blood clot Pain  If you're not comfortable with these risks and would like to continue with nonoperative treatment please let Dr. Aline Brochure know prior to your surgery.  Meniscus Injury, Arthroscopy Arthroscopy is a surgical procedure that involves the use of a small scope that has a camera and surgical instruments on the end (arthroscope). An arthroscope can be used to repair your meniscus injury.  LET [facility] CARE PROVIDER KNOW ABOUT:  Any allergies you have.  All medicines you are taking, including vitamins, herbs, eyedrops, creams, and over-the-counter medicines.  Any recent colds or infections you have had or currently have.  Previous problems you or members of your family have had with the use of anesthetics.  Any blood disorders or blood clotting problems you have.  Previous surgeries you have had.  Medical conditions you have. RISKS AND COMPLICATIONS Generally, this is a safe procedure. However, as with any procedure, problems can occur. Possible problems include:  Damage to nerves or blood vessels.  Excess bleeding.  Blood clots.  Infection. BEFORE THE PROCEDURE  Do not eat or drink for 6-8 hours before the procedure.  Take medicines as directed by your surgeon. Ask your surgeon about changing or stopping your regular medicines.  You may have lab tests the morning of surgery. PROCEDURE  You will be given one of the following:   A medicine that numbs the area (local  anesthesia).  A medicine that makes you go to sleep (general anesthesia).  A medicine injected into your spine that numbs your body below the waist (spinal anesthesia). Most often, several small cuts (incisions) are made in the knee. The arthroscope and instruments go into the incisions to repair the damage. The torn portion of the meniscus is removed.  During this time, your surgeon may find a partial or complete tear in a cruciate ligament, such as the anterior cruciate ligament (ACL). A completely torn cruciate ligament is reconstructed by taking tissue from another part of the body (grafting) and placing it into the injured area. This requires several larger incisions to complete the repair. Sometimes, open surgery is needed for collateral ligament injuries. If a collateral ligament is found to be injured, your surgeon may staple or suture the tear through a slightly larger incision on the side of the knee. AFTER THE PROCEDURE You will be taken to the recovery area where your progress will be monitored. When you are awake, stable, and taking fluids without complications, you will be allowed to go home. This is usually the same day. However, more extensive repairs of a ligament may require an overnight stay.  The recovery time after repairing your meniscus or ligament depends on the amount of damage to these structures. It also depends on whether or not reconstructive knee surgery was needed.   A torn or stretched ligament (ligament sprain) may take 6-8 weeks to heal. It takes about the same amount of time if your surgeon removed a torn meniscus.  A repaired meniscus may require 6-12 weeks of recovery time.  A torn ligament needing reconstructive surgery may  take 6-12 months to heal fully. Document Released: 01/17/2000 Document Revised: 01/24/2013 Document Reviewed: 06/17/2012 [facility] Patient Information 2015 [place], [place]. This information is not intended to replace advice given to you by  your health care provider. Make sure you discuss any questions you have with your health care provider.

## 2014-11-01 NOTE — Progress Notes (Signed)
Patient ID: Caitlin Bond, female   DOB: 03/03/1960, 54 y.o.   MRN: [id number]  ESTABLISHED PATIENT NEW PROBLEM   Chief Complaint  Patient presents with  . Follow-up    Follow up persistent right knee pain     Caitlin Bond is a 54 y.o. female.   HPI 54 year old female previously evaluated and scheduled for knee arthroscopy but she didn't have insurance so she canceled the surgery.  As noted in her visit back in 2016 and September: she was having problems with her right knee. She was given an intra-articular injection of cortisone injected pain relief but still complains of pain swelling stiffness and inability to fully extend the knee with giving out symptoms. She indicates that she's had difficulties with the knee even as a child with the knee giving out and going out of place. She complains of constant 6/10 pain. She had x-rays my interpretation of those x-rays is that she has fairly severe degenerative arthritis especially for her age of 23. She was also treated with ibuprofen and ice but she had a bleeding polyp and has since had that removed. She has difficulty standing walking getting up after sitting lying down and driving.   She now presents back for arthroscopy and meniscectomy  Review of Systems Malaise history of positive blood in the stool history of back pain and joint pain and rash of the skin otherwise normal  Past Medical History  Diagnosis Date  . Hypertension     Past Surgical History  Procedure Laterality Date  . Urethral dilation      as child  . Colonoscopy N/A 09/12/2013    Procedure: COLONOSCOPY;  Surgeon: Daneil Dolin, MD;  Location: [facility];  Service: Endoscopy;  Laterality: N/A;  9:45    Family History  Problem Relation Age of Onset  . Colon cancer Neg Hx     Social History Social History  Substance Use Topics  . Smoking status: Current Some Day Smoker -- 0.10 packs/day    Types: Cigarettes  . Smokeless tobacco: Not on file  . Alcohol  Use: Yes     Comment: 5 per week    Allergies  Allergen Reactions  . Haldol [Haloperidol Lactate] Other (See Comments)    Eyes rolling back in head; neck stiffened.    Current Outpatient Prescriptions  Medication Sig Dispense Refill  . HYDROcodone-acetaminophen (NORCO/VICODIN) 5-325 MG per tablet Take 1 tablet by mouth every 4 (four) hours as needed. 15 tablet 0  . losartan-hydrochlorothiazide (HYZAAR) 50-12.5 MG per tablet Take 1 tablet by mouth daily.    . methocarbamol (ROBAXIN) 500 MG tablet Take 1 tablet (500 mg total) by mouth 2 (two) times daily. 20 tablet 0  . metoprolol succinate (TOPROL-XL) 25 MG 24 hr tablet Take 25 mg by mouth daily.    . naproxen (NAPROSYN) 500 MG tablet Take 1 tablet (500 mg total) by mouth 2 (two) times daily. 20 tablet 0   No current facility-administered medications for this visit.       Physical Exam Blood pressure 119/79, height 5\' 7"  (1.702 m), weight 266 lb 3.2 oz (120.748 kg). Physical Exam  Gen. appearance development nutrition body habitus normal she has some dental deficiencies. Peripheral pulses are intact extremities are warm to touch there is no edema no swelling paragraph groin lymph nodes are negative paragraph ambulation is normal without assistive device.  Left knee no tenderness swelling or crepitation, full range of motion, ligaments stable. Strength normal. Muscle tone normal.  Right knee medial and lateral joint line tenderness no effusion. Flexion 120 extension she lacks 5. Ligament stable. Muscle strength and tone normal. Meniscal tests were equivocal.  Skin all 4 extremities normal. Upper extremities normal. Fine motor coordination normal. Reflexes 2+ normal. Sensory exam soft touch proprioception normal. Orientation times person place normal. Mood affect normal. Paragraph she brought an x-ray with her on a disc it shows moderate to severe joint space narrowing on both sides of the joint indicating severe arthritis especially  when you account for her age of 13  Data Reviewed  The images did show osteoarthritis torn medial meniscus degenerative cartilage in the lateral meniscus as well. Extensive medial compartment degeneration. Assessment   Osteoarthritis right knee, Torn medial meniscus right knee Plan   Scheduled for arthroscopy right knee and partial medial meniscectomy

## 2014-11-01 NOTE — Addendum Note (Signed)
Addended by: Arther Abbott E on: 11/01/2014 10:56 AM   Modules accepted: Orders, SmartSet

## 2014-11-05 ENCOUNTER — Telehealth: Payer: Self-pay | Admitting: Orthopedic Surgery

## 2014-11-05 NOTE — Telephone Encounter (Signed)
Regarding out-patient surgery scheduled at [facility] 11/09/14 for CPT 29881 / 29880, per Medicaid's portal, [place] [employer], no pre-authorization is required.

## 2014-11-06 NOTE — Patient Instructions (Signed)
Caitlin Bond  11/06/2014     @PREFPERIOPPHARMACY @   Your procedure is scheduled on 11/09/2014  Report to [facility] at 7:20 A.M.  Call this number if you have problems the morning of surgery:  [phone]   Remember:  Do not eat food or drink liquids after midnight.  Take these medicines the morning of surgery with A SIP OF WATER Hydrocodone, Robaxin, Metoprolol   Do not wear jewelry, make-up or nail polish.  Do not wear lotions, powders, or perfumes.  You may wear deodorant.  Do not shave 48 hours prior to surgery.  Men may shave face and neck.  Do not bring valuables to the hospital.  [facility] is not responsible for any belongings or valuables.  Contacts, dentures or bridgework may not be worn into surgery.  Leave your suitcase in the car.  After surgery it may be brought to your room.  For patients admitted to the hospital, discharge time will be determined by your treatment team.  Patients discharged the day of surgery will not be allowed to drive home.    Please read over the following fact sheets that you were given. Surgical Site Infection Prevention and Anesthesia Post-op Instructions     PATIENT INSTRUCTIONS POST-ANESTHESIA  IMMEDIATELY FOLLOWING SURGERY:  Do not drive or operate machinery for the first twenty four hours after surgery.  Do not make any important decisions for twenty four hours after surgery or while taking narcotic pain medications or sedatives.  If you develop intractable nausea and vomiting or a severe headache please notify your doctor immediately.  FOLLOW-UP:  Please make an appointment with your surgeon as instructed. You do not need to follow up with anesthesia unless specifically instructed to do so.  WOUND CARE INSTRUCTIONS (if applicable):  Keep a dry clean dressing on the anesthesia/puncture wound site if there is drainage.  Once the wound has quit draining you may leave it open to air.  Generally you should leave the bandage intact  for twenty four hours unless there is drainage.  If the epidural site drains for more than 36-48 hours please call the anesthesia department.  QUESTIONS?:  Please feel free to call your physician or the hospital operator if you have any questions, and they will be happy to assist you.      Arthroscopic Procedure, Knee An arthroscopic procedure can find what is wrong with your knee. PROCEDURE Arthroscopy is a surgical technique that allows your orthopedic surgeon to diagnose and treat your knee injury with accuracy. They will look into your knee through a small instrument. This is almost like a small (pencil sized) telescope. Because arthroscopy affects your knee less than open knee surgery, you can anticipate a more rapid recovery. Taking an active role by following your caregiver's instructions will help with rapid and complete recovery. Use crutches, rest, elevation, ice, and knee exercises as instructed. The length of recovery depends on various factors including type of injury, age, physical condition, medical conditions, and your rehabilitation. Your knee is the joint between the large bones (femur and tibia) in your leg. Cartilage covers these bone ends which are smooth and slippery and allow your knee to bend and move smoothly. Two menisci, thick, semi-lunar shaped pads of cartilage which form a rim inside the joint, help absorb shock and stabilize your knee. Ligaments bind the bones together and support your knee joint. Muscles move the joint, help support your knee, and take stress off the joint itself. Because of this  all programs and physical therapy to rehabilitate an injured or repaired knee require rebuilding and strengthening your muscles. AFTER THE PROCEDURE  After the procedure, you will be moved to a recovery area until most of the effects of the medication have worn off. Your caregiver will discuss the test results with you.  Only take over-the-counter or prescription medicines for  pain, discomfort, or fever as directed by your caregiver. SEEK MEDICAL CARE IF:   You have increased bleeding from your wounds.  You see redness, swelling, or have increasing pain in your wounds.  You have pus coming from your wound.  You have an oral temperature above 102 F (38.9 C).  You notice a bad smell coming from the wound or dressing.  You have severe pain with any motion of your knee. SEEK IMMEDIATE MEDICAL CARE IF:   You develop a rash.  You have difficulty breathing.  You have any allergic problems. Document Released: 01/17/2000 Document Revised: 04/13/2011 Document Reviewed: 08/10/2007 [facility] [employer] Patient Information 2015 [place], [place]. This information is not intended to replace advice given to you by your health care provider. Make sure you discuss any questions you have with your health care provider.

## 2014-11-07 ENCOUNTER — Encounter (HOSPITAL_COMMUNITY): Payer: Self-pay

## 2014-11-07 ENCOUNTER — Encounter (HOSPITAL_COMMUNITY)
Admission: RE | Admit: 2014-11-07 | Discharge: 2014-11-07 | Disposition: A | Discharge status: Home / Self Care | Payer: Medicaid Other | Source: Ambulatory Visit | Attending: Orthopedic Surgery | Admitting: Orthopedic Surgery

## 2014-11-07 ENCOUNTER — Other Ambulatory Visit: Payer: Self-pay

## 2014-11-07 DIAGNOSIS — Z01818 Encounter for other preprocedural examination: Secondary | ICD-10-CM | POA: Insufficient documentation

## 2014-11-07 DIAGNOSIS — S83206D Unspecified tear of unspecified meniscus, current injury, right knee, subsequent encounter: Secondary | ICD-10-CM | POA: Diagnosis not present

## 2014-11-07 DIAGNOSIS — M179 Osteoarthritis of knee, unspecified: Secondary | ICD-10-CM | POA: Diagnosis not present

## 2014-11-07 HISTORY — DX: Anxiety disorder, unspecified: F41.9

## 2014-11-07 HISTORY — DX: Gastro-esophageal reflux disease without esophagitis: K21.9

## 2014-11-07 HISTORY — DX: Unspecified osteoarthritis, unspecified site: M19.90

## 2014-11-07 LAB — CBC WITH DIFFERENTIAL/PLATELET
BASOS PCT: 0 %
Basophils Absolute: 0 10*3/uL (ref 0.0–0.1)
EOS ABS: 0.2 10*3/uL (ref 0.0–0.7)
Eosinophils Relative: 2 %
HCT: 40.2 % (ref 36.0–46.0)
Hemoglobin: 14.2 g/dL (ref 12.0–15.0)
LYMPHS ABS: 1.8 10*3/uL (ref 0.7–4.0)
Lymphocytes Relative: 23 %
MCH: 34.5 pg — ABNORMAL HIGH (ref 26.0–34.0)
MCHC: 35.3 g/dL (ref 30.0–36.0)
MCV: 97.6 fL (ref 78.0–100.0)
Monocytes Absolute: 0.5 10*3/uL (ref 0.1–1.0)
Monocytes Relative: 7 %
NEUTROS PCT: 68 %
Neutro Abs: 5.1 10*3/uL (ref 1.7–7.7)
Platelets: 236 10*3/uL (ref 150–400)
RBC: 4.12 MIL/uL (ref 3.87–5.11)
RDW: 13 % (ref 11.5–15.5)
WBC: 7.6 10*3/uL (ref 4.0–10.5)

## 2014-11-07 LAB — BASIC METABOLIC PANEL
Anion gap: 8 (ref 5–15)
BUN: 16 mg/dL (ref 6–20)
CO2: 28 mmol/L (ref 22–32)
Calcium: 9.2 mg/dL (ref 8.9–10.3)
Chloride: 105 mmol/L (ref 101–111)
Creatinine, Ser: 0.62 mg/dL (ref 0.44–1.00)
Glucose, Bld: 144 mg/dL — ABNORMAL HIGH (ref 65–99)
POTASSIUM: 3.4 mmol/L — AB (ref 3.5–5.1)
SODIUM: 141 mmol/L (ref 135–145)

## 2014-11-07 NOTE — Pre-Procedure Instructions (Signed)
Patient given information to sign up for my chart at home. 

## 2014-11-08 NOTE — H&P (Signed)
Patient ID: Caitlin Bond, female   DOB: 1960/03/31, 54 y.o.   MRN: [id number]  ESTABLISHED PATIENT NEW PROBLEM     Chief Complaint   Patient presents with   .  Follow-up       Follow up persistent right knee pain      Caitlin Bond is a 54 y.o. female.   HPI 54 year old female previously evaluated and scheduled for knee arthroscopy but she didn't have insurance so she canceled the surgery.  As noted in her visit back in 2016 and September: she was having problems with her right knee. She was given an intra-articular injection of cortisone injected pain relief but still complains of pain swelling stiffness and inability to fully extend the knee with giving out symptoms. She indicates that she's had difficulties with the knee even as a child with the knee giving out and going out of place. She complains of constant 6/10 pain. She had x-rays my interpretation of those x-rays is that she has fairly severe degenerative arthritis especially for her age of 48. She was also treated with ibuprofen and ice but she had a bleeding polyp and has since had that removed. She has difficulty standing walking getting up after sitting lying down and driving.   She now presents back for arthroscopy and meniscectomy  Review of Systems Malaise history of positive blood in the stool history of back pain and joint pain and rash of the skin otherwise normal    Past Medical History   Diagnosis  Date   .  Hypertension         Past Surgical History   Procedure  Laterality  Date   .  Urethral dilation           as child   .  Colonoscopy  N/A  09/12/2013       Procedure: COLONOSCOPY;  Surgeon: Daneil Dolin, MD;  Location: [facility];  Service: Endoscopy;  Laterality: N/A;  9:45       Family History   Problem  Relation  Age of Onset   .  Colon cancer  Neg Hx       Social History Social History   Substance Use Topics   .  Smoking status:  Current Some Day Smoker -- 0.10 packs/day       Types:   Cigarettes   .  Smokeless tobacco:  Not on file   .  Alcohol Use:  Yes         Comment: 5 per week       Allergies   Allergen  Reactions   .  Haldol [Haloperidol Lactate]  Other (See Comments)       Eyes rolling back in head; neck stiffened.       Current Outpatient Prescriptions   Medication  Sig  Dispense  Refill   .  HYDROcodone-acetaminophen (NORCO/VICODIN) 5-325 MG per tablet  Take 1 tablet by mouth every 4 (four) hours as needed.  15 tablet  0   .  losartan-hydrochlorothiazide (HYZAAR) 50-12.5 MG per tablet  Take 1 tablet by mouth daily.       .  methocarbamol (ROBAXIN) 500 MG tablet  Take 1 tablet (500 mg total) by mouth 2 (two) times daily.  20 tablet  0   .  metoprolol succinate (TOPROL-XL) 25 MG 24 hr tablet  Take 25 mg by mouth daily.       .  naproxen (NAPROSYN) 500 MG tablet  Take 1 tablet (  500 mg total) by mouth 2 (two) times daily.  20 tablet  0      No current facility-administered medications for this visit.        Physical Exam Blood pressure 119/79, height 5\' 7"  (1.702 m), weight 266 lb 3.2 oz (120.748 kg). Physical Exam  Gen. appearance development nutrition body habitus normal she has some dental deficiencies. Peripheral pulses are intact extremities are warm to touch there is no edema no swelling paragraph groin lymph nodes are negative paragraph ambulation is normal without assistive device.  Left knee no tenderness swelling or crepitation, full range of motion, ligaments stable. Strength normal. Muscle tone normal.  Right knee medial and lateral joint line tenderness no effusion. Flexion 120 extension she lacks 5. Ligament stable. Muscle strength and tone normal. Meniscal tests were equivocal.  Skin all 4 extremities normal. Upper extremities normal. Fine motor coordination normal. Reflexes 2+ normal. Sensory exam soft touch proprioception normal. Orientation times person place normal. Mood affect normal. Paragraph she brought an x-ray with her on a disc  it shows moderate to severe joint space narrowing on both sides of the joint indicating severe arthritis especially when you account for her age of 67  Data Reviewed  The images did show osteoarthritis torn medial meniscus degenerative cartilage in the lateral meniscus as well. Extensive medial compartment degeneration. Assessment   Osteoarthritis right knee, Torn medial meniscus right knee Plan   Scheduled for arthroscopy right knee and partial medial meniscectomy

## 2014-11-09 ENCOUNTER — Encounter (HOSPITAL_COMMUNITY)
Admission: RE | Disposition: A | Discharge status: Home / Self Care | Payer: Self-pay | Source: Ambulatory Visit | Attending: Orthopedic Surgery

## 2014-11-09 ENCOUNTER — Ambulatory Visit (HOSPITAL_COMMUNITY): Payer: Medicaid Other | Admitting: Anesthesiology

## 2014-11-09 ENCOUNTER — Encounter (HOSPITAL_COMMUNITY): Payer: Self-pay | Admitting: *Deleted

## 2014-11-09 ENCOUNTER — Ambulatory Visit (HOSPITAL_COMMUNITY)
Admission: RE | Admit: 2014-11-09 | Discharge: 2014-11-09 | Disposition: A | Discharge status: Home / Self Care | Payer: Medicaid Other | Source: Ambulatory Visit | Attending: Orthopedic Surgery | Admitting: Orthopedic Surgery

## 2014-11-09 DIAGNOSIS — K219 Gastro-esophageal reflux disease without esophagitis: Secondary | ICD-10-CM | POA: Insufficient documentation

## 2014-11-09 DIAGNOSIS — F419 Anxiety disorder, unspecified: Secondary | ICD-10-CM | POA: Diagnosis not present

## 2014-11-09 DIAGNOSIS — I10 Essential (primary) hypertension: Secondary | ICD-10-CM | POA: Insufficient documentation

## 2014-11-09 DIAGNOSIS — M179 Osteoarthritis of knee, unspecified: Secondary | ICD-10-CM | POA: Diagnosis not present

## 2014-11-09 DIAGNOSIS — F17210 Nicotine dependence, cigarettes, uncomplicated: Secondary | ICD-10-CM | POA: Insufficient documentation

## 2014-11-09 DIAGNOSIS — M23203 Derangement of unspecified medial meniscus due to old tear or injury, right knee: Secondary | ICD-10-CM | POA: Diagnosis present

## 2014-11-09 DIAGNOSIS — Z791 Long term (current) use of non-steroidal anti-inflammatories (NSAID): Secondary | ICD-10-CM | POA: Diagnosis not present

## 2014-11-09 DIAGNOSIS — M65861 Other synovitis and tenosynovitis, right lower leg: Secondary | ICD-10-CM | POA: Insufficient documentation

## 2014-11-09 DIAGNOSIS — M23321 Other meniscus derangements, posterior horn of medial meniscus, right knee: Secondary | ICD-10-CM

## 2014-11-09 DIAGNOSIS — M23221 Derangement of posterior horn of medial meniscus due to old tear or injury, right knee: Secondary | ICD-10-CM | POA: Diagnosis not present

## 2014-11-09 DIAGNOSIS — Z6841 Body Mass Index (BMI) 40.0 and over, adult: Secondary | ICD-10-CM | POA: Diagnosis not present

## 2014-11-09 DIAGNOSIS — M23329 Other meniscus derangements, posterior horn of medial meniscus, unspecified knee: Secondary | ICD-10-CM | POA: Insufficient documentation

## 2014-11-09 DIAGNOSIS — M1711 Unilateral primary osteoarthritis, right knee: Secondary | ICD-10-CM

## 2014-11-09 DIAGNOSIS — Z79899 Other long term (current) drug therapy: Secondary | ICD-10-CM | POA: Diagnosis not present

## 2014-11-09 HISTORY — PX: KNEE ARTHROSCOPY WITH MEDIAL MENISECTOMY: SHX5651

## 2014-11-09 SURGERY — ARTHROSCOPY, KNEE, WITH MEDIAL MENISCECTOMY
Anesthesia: General | Site: Knee | Laterality: Right

## 2014-11-09 MED ORDER — EPINEPHRINE HCL 1 MG/ML IJ SOLN
INTRAMUSCULAR | Status: AC
  Filled 2014-11-09: qty 1

## 2014-11-09 MED ORDER — CEFAZOLIN SODIUM-DEXTROSE 2-3 GM-% IV SOLR
INTRAVENOUS | Status: AC
Start: 2014-11-09 — End: 2014-11-09
  Filled 2014-11-09: qty 50

## 2014-11-09 MED ORDER — EPINEPHRINE HCL 1 MG/ML IJ SOLN
INTRAMUSCULAR | Status: AC
  Filled 2014-11-09: qty 2

## 2014-11-09 MED ORDER — PROMETHAZINE HCL 12.5 MG PO TABS: 12.5000 mg | ORAL_TABLET | Freq: Four times a day (QID) | ORAL | Status: DC | PRN

## 2014-11-09 MED ORDER — OXYCODONE-ACETAMINOPHEN 5-325 MG PO TABS: 1.0000 | ORAL_TABLET | ORAL | Status: DC | PRN

## 2014-11-09 MED ORDER — MIDAZOLAM HCL 2 MG/2ML IJ SOLN
INTRAMUSCULAR | Status: AC
  Filled 2014-11-09: qty 2

## 2014-11-09 MED ORDER — BUPIVACAINE-EPINEPHRINE (PF) 0.5% -1:200000 IJ SOLN
INTRAMUSCULAR | Status: DC | PRN
  Administered 2014-11-09: 60 mL

## 2014-11-09 MED ORDER — ROCURONIUM BROMIDE 100 MG/10ML IV SOLN
INTRAVENOUS | Status: DC | PRN
  Administered 2014-11-09: 30 mg via INTRAVENOUS

## 2014-11-09 MED ORDER — MIDAZOLAM HCL 2 MG/2ML IJ SOLN
1.0000 mg | INTRAMUSCULAR | Status: DC | PRN
  Administered 2014-11-09 (×2): 2 mg via INTRAVENOUS
  Filled 2014-11-09: qty 2

## 2014-11-09 MED ORDER — GLYCOPYRROLATE 0.2 MG/ML IJ SOLN
INTRAMUSCULAR | Status: DC | PRN
  Administered 2014-11-09: 0.6 mg via INTRAVENOUS

## 2014-11-09 MED ORDER — LIDOCAINE HCL 1 % IJ SOLN
INTRAMUSCULAR | Status: DC | PRN
  Administered 2014-11-09: 30 mg via INTRADERMAL

## 2014-11-09 MED ORDER — CEFAZOLIN SODIUM 1-5 GM-% IV SOLN: 1.0000 g | INTRAVENOUS | Status: DC

## 2014-11-09 MED ORDER — FENTANYL CITRATE (PF) 250 MCG/5ML IJ SOLN
INTRAMUSCULAR | Status: AC
  Filled 2014-11-09: qty 25

## 2014-11-09 MED ORDER — PROPOFOL 10 MG/ML IV BOLUS
INTRAVENOUS | Status: AC
  Filled 2014-11-09: qty 20

## 2014-11-09 MED ORDER — LACTATED RINGERS IV SOLN
INTRAVENOUS | Status: DC
  Administered 2014-11-09: 08:00:00 via INTRAVENOUS

## 2014-11-09 MED ORDER — CEFAZOLIN SODIUM-DEXTROSE 2-3 GM-% IV SOLR: 2.0000 g | INTRAVENOUS | Status: DC

## 2014-11-09 MED ORDER — GLYCOPYRROLATE 0.2 MG/ML IJ SOLN
0.2000 mg | Freq: Once | INTRAMUSCULAR | Status: AC
  Administered 2014-11-09: 0.2 mg via INTRAVENOUS

## 2014-11-09 MED ORDER — ONDANSETRON HCL 4 MG/2ML IJ SOLN
4.0000 mg | Freq: Once | INTRAMUSCULAR | Status: AC
  Administered 2014-11-09: 4 mg via INTRAVENOUS

## 2014-11-09 MED ORDER — OXYCODONE HCL 5 MG PO TABS
5.0000 mg | ORAL_TABLET | Freq: Once | ORAL | Status: AC
  Administered 2014-11-09: 5 mg via ORAL

## 2014-11-09 MED ORDER — OXYCODONE HCL 5 MG PO TABS
ORAL_TABLET | ORAL | Status: AC
  Filled 2014-11-09: qty 1

## 2014-11-09 MED ORDER — SODIUM CHLORIDE 0.9 % IR SOLN
Status: DC | PRN
  Administered 2014-11-09 (×4): 3000 mL

## 2014-11-09 MED ORDER — ONDANSETRON HCL 4 MG/2ML IJ SOLN
INTRAMUSCULAR | Status: AC
  Filled 2014-11-09: qty 2

## 2014-11-09 MED ORDER — KETOROLAC TROMETHAMINE 30 MG/ML IJ SOLN
30.0000 mg | Freq: Once | INTRAMUSCULAR | Status: AC
  Administered 2014-11-09: 30 mg via INTRAVENOUS

## 2014-11-09 MED ORDER — NEOSTIGMINE METHYLSULFATE 10 MG/10ML IV SOLN
INTRAVENOUS | Status: DC | PRN
  Administered 2014-11-09 (×2): 2 mg via INTRAVENOUS

## 2014-11-09 MED ORDER — MIDAZOLAM HCL 2 MG/2ML IJ SOLN
INTRAMUSCULAR | Status: AC
  Filled 2014-11-09: qty 4

## 2014-11-09 MED ORDER — BUPIVACAINE-EPINEPHRINE (PF) 0.5% -1:200000 IJ SOLN
INTRAMUSCULAR | Status: AC
  Filled 2014-11-09: qty 60

## 2014-11-09 MED ORDER — FENTANYL CITRATE (PF) 100 MCG/2ML IJ SOLN: 25.0000 ug | INTRAMUSCULAR | Status: DC | PRN

## 2014-11-09 MED ORDER — CHLORHEXIDINE GLUCONATE 4 % EX LIQD: 60.0000 mL | Freq: Once | CUTANEOUS | Status: DC

## 2014-11-09 MED ORDER — FENTANYL CITRATE (PF) 100 MCG/2ML IJ SOLN
INTRAMUSCULAR | Status: DC | PRN
  Administered 2014-11-09: 100 ug via INTRAVENOUS

## 2014-11-09 MED ORDER — PROPOFOL 10 MG/ML IV BOLUS
INTRAVENOUS | Status: DC | PRN
  Administered 2014-11-09: 170 mg via INTRAVENOUS

## 2014-11-09 MED ORDER — DEXTROSE 5 % IV SOLN: 3.0000 g | INTRAVENOUS | Status: DC

## 2014-11-09 MED ORDER — CEFAZOLIN SODIUM 10 G IJ SOLR
3.0000 g | INTRAMUSCULAR | Status: DC | PRN
  Administered 2014-11-09: 3 g via INTRAVENOUS

## 2014-11-09 MED ORDER — MIDAZOLAM HCL 5 MG/5ML IJ SOLN
INTRAMUSCULAR | Status: DC | PRN
  Administered 2014-11-09: 2 mg via INTRAVENOUS

## 2014-11-09 MED ORDER — GLYCOPYRROLATE 0.2 MG/ML IJ SOLN
INTRAMUSCULAR | Status: AC
  Filled 2014-11-09: qty 1

## 2014-11-09 MED ORDER — CEFAZOLIN SODIUM 1-5 GM-% IV SOLN
INTRAVENOUS | Status: AC
  Filled 2014-11-09: qty 50

## 2014-11-09 MED ORDER — ONDANSETRON HCL 4 MG/2ML IJ SOLN: 4.0000 mg | Freq: Once | INTRAMUSCULAR | Status: DC | PRN

## 2014-11-09 MED ORDER — SUCCINYLCHOLINE CHLORIDE 20 MG/ML IJ SOLN
INTRAMUSCULAR | Status: DC | PRN
  Administered 2014-11-09: 180 mg via INTRAVENOUS

## 2014-11-09 MED ORDER — KETOROLAC TROMETHAMINE 30 MG/ML IJ SOLN
INTRAMUSCULAR | Status: AC
  Filled 2014-11-09: qty 1

## 2014-11-09 SURGICAL SUPPLY — 54 items
ARTHROWAND PARAGON T2 (SURGICAL WAND)
BAG HAMPER (MISCELLANEOUS) ×3 IMPLANT
BANDAGE ELASTIC 6 VELCRO NS (GAUZE/BANDAGES/DRESSINGS) ×3 IMPLANT
BLADE AGGRESSIVE PLUS 4.0 (BLADE) ×3 IMPLANT
CHLORAPREP W/TINT 26ML (MISCELLANEOUS) ×3 IMPLANT
CLOTH BEACON ORANGE TIMEOUT ST (SAFETY) ×3 IMPLANT
COOLER CRYO IC GRAV AND TUBE (ORTHOPEDIC SUPPLIES) ×3 IMPLANT
CUFF CRYO KNEE LG 20X31 COOLER (ORTHOPEDIC SUPPLIES) IMPLANT
CUFF CRYO KNEE18X23 MED (MISCELLANEOUS) ×3 IMPLANT
CUFF TOURNIQUET SINGLE 34IN LL (TOURNIQUET CUFF) ×3 IMPLANT
CUFF TOURNIQUET SINGLE 44IN (TOURNIQUET CUFF) IMPLANT
CUTTER ANGLED DBL BITE 4.5 (BURR) IMPLANT
DECANTER SPIKE VIAL GLASS SM (MISCELLANEOUS) ×6 IMPLANT
GAUZE SPONGE 4X4 12PLY STRL (GAUZE/BANDAGES/DRESSINGS) ×3 IMPLANT
GAUZE SPONGE 4X4 12PLY STRL LF (GAUZE/BANDAGES/DRESSINGS) ×3 IMPLANT
GAUZE SPONGE 4X4 16PLY XRAY LF (GAUZE/BANDAGES/DRESSINGS) ×3 IMPLANT
GAUZE XEROFORM 5X9 LF (GAUZE/BANDAGES/DRESSINGS) ×3 IMPLANT
GLOVE BIOGEL M 7.0 STRL (GLOVE) ×3 IMPLANT
GLOVE BIOGEL PI IND STRL 7.0 (GLOVE) ×1 IMPLANT
GLOVE BIOGEL PI INDICATOR 7.0 (GLOVE) ×2
GLOVE EXAM NITRILE MD LF STRL (GLOVE) ×3 IMPLANT
GLOVE SKINSENSE NS SZ8.0 LF (GLOVE) ×2
GLOVE SKINSENSE STRL SZ8.0 LF (GLOVE) ×1 IMPLANT
GLOVE SS N UNI LF 8.5 STRL (GLOVE) ×3 IMPLANT
GOWN STRL REUS W/ TWL LRG LVL3 (GOWN DISPOSABLE) ×1 IMPLANT
GOWN STRL REUS W/TWL LRG LVL3 (GOWN DISPOSABLE) ×2
GOWN STRL REUS W/TWL XL LVL3 (GOWN DISPOSABLE) ×3 IMPLANT
HLDR LEG FOAM (MISCELLANEOUS) ×1 IMPLANT
IV NS IRRIG 3000ML ARTHROMATIC (IV SOLUTION) ×12 IMPLANT
KIT BLADEGUARD II DBL (SET/KITS/TRAYS/PACK) ×3 IMPLANT
KIT ROOM TURNOVER AP CYSTO (KITS) ×3 IMPLANT
LEG HOLDER FOAM (MISCELLANEOUS) ×2
MANIFOLD NEPTUNE II (INSTRUMENTS) ×3 IMPLANT
MARKER SKIN DUAL TIP RULER LAB (MISCELLANEOUS) ×3 IMPLANT
NEEDLE HYPO 18GX1.5 BLUNT FILL (NEEDLE) ×3 IMPLANT
NEEDLE HYPO 21X1.5 SAFETY (NEEDLE) ×3 IMPLANT
NEEDLE SPNL 18GX3.5 QUINCKE PK (NEEDLE) ×3 IMPLANT
NS IRRIG 1000ML POUR BTL (IV SOLUTION) ×3 IMPLANT
PACK ARTHRO LIMB DRAPE STRL (MISCELLANEOUS) ×3 IMPLANT
PAD ABD 5X9 TENDERSORB (GAUZE/BANDAGES/DRESSINGS) ×3 IMPLANT
PAD ABD 8X10 STRL (GAUZE/BANDAGES/DRESSINGS) ×3 IMPLANT
PAD ARMBOARD 7.5X6 YLW CONV (MISCELLANEOUS) ×3 IMPLANT
PADDING CAST COTTON 6X4 STRL (CAST SUPPLIES) ×3 IMPLANT
PADDING WEBRIL 6 STERILE (GAUZE/BANDAGES/DRESSINGS) ×3 IMPLANT
SET ARTHROSCOPY INST (INSTRUMENTS) ×3 IMPLANT
SET ARTHROSCOPY PUMP TUBE (IRRIGATION / IRRIGATOR) ×3 IMPLANT
SET BASIN LINEN APH (SET/KITS/TRAYS/PACK) ×3 IMPLANT
SUT ETHILON 3 0 FSL (SUTURE) ×3 IMPLANT
SYR 30ML LL (SYRINGE) ×3 IMPLANT
SYRINGE 10CC LL (SYRINGE) ×3 IMPLANT
WAND 50 DEG COVAC W/CORD (SURGICAL WAND) ×3 IMPLANT
WAND 90 DEG TURBOVAC W/CORD (SURGICAL WAND) IMPLANT
WAND ARTHRO PARAGON T2 (SURGICAL WAND) IMPLANT
YANKAUER SUCT BULB TIP 10FT TU (MISCELLANEOUS) ×9 IMPLANT

## 2014-11-09 NOTE — Op Note (Signed)
11/09/2014  10:20 AM  PATIENT:  Caitlin Bond  54 y.o. female  PRE-OPERATIVE DIAGNOSIS:  medial meniscus tear right knee and osteoarthritis  POST-OPERATIVE DIAGNOSIS:  medial meniscus tear right knee and osteoarthritis  PROCEDURE:  Procedure(s): KNEE ARTHROSCOPY WITH MEDIAL MENISECTOMY AND LIMITED DEBRIDEMENT (Right)   Operative findings torn medial meniscus right knee complex tear posterior horn and midbody with chondral changes grade 2 on the medial femoral condyle. Femoral and tibial bone spurs in the notch which were removed  Anterior cruciate ligament PCL intact  Lateral compartment free edge tear lateral meniscus which was debrided  Patellofemoral joint normal  Moderate synovitis  Surgeon Aline Brochure no assistants  Anesthesia Gen.  No blood loss  Marcaine epinephrine 60 mL injected into the joint postoperatively  No complications  No specimens   COUNTS:  YES  TOURNIQUET:    DICTATION: .Dragon Dictation  PLAN OF CARE: Discharge to home after PACU  PATIENT DISPOSITION:  PACU - hemodynamically stable.   Delay start of Pharmacological VTE agent (>24hrs) due to surgical blood loss or risk of bleeding: not applicable  Knee arthroscopy dictation  The patient was identified in the preoperative holding area using 2 approved identification mechanisms. The chart was reviewed and updated. The surgical site was confirmed and marked with an indelible marker.  The patient was taken to the operating room for anesthesia. After successful  GENERAL  anesthesia, 3GM ANCEF was used as IV antibiotics.  The patient was placed in the supine position with the (RIGHT KNEE) operative extremity in an arthroscopic leg holder and the opposite extremity in a padded leg holder.  The timeout was executed.  A lateral portal was established with an 11 blade and the scope was introduced into the joint. A diagnostic arthroscopy was performed in circumferential manner examining the entire  knee joint. A medial portal was established and the diagnostic arthroscopy was repeated using a probe to palpate intra-articular structures as they were encountered.   The operative findings are     The MEDIAL meniscus was resected using a duckbill forceps. The meniscal fragments were removed with a motorized shaver. The meniscus was balanced with a combination of a motorized shaver and a 50 ArthroCare wand until a stable rim was obtained.  The bowel to Bovie was used to remove osteophytes from the tibial surface and the femoral condylar surface which were blocking full extension.  The free edge tear of the lateral meniscus was debrided.  The arthroscopic pump was placed on the wash mode and any excess debris was removed from the joint using suction.  60 cc of Marcaine with epinephrine was injected through the arthroscope.  The portals were closed with 3-0 nylon suture.  A sterile bandage, Ace wrap and Cryo/Cuff was placed and the Cryo/Cuff was activated. The patient was taken to the recovery room in stable condition.

## 2014-11-09 NOTE — Anesthesia Procedure Notes (Signed)
Procedure Name: Intubation Date/Time: 11/09/2014 9:21 AM Performed by: Charmaine Downs Pre-anesthesia Checklist: Emergency Drugs available, Patient identified, Suction available and Patient being monitored Patient Re-evaluated:Patient Re-evaluated prior to inductionOxygen Delivery Method: Circle system utilized Preoxygenation: Pre-oxygenation with 100% oxygen Intubation Type: IV induction, Cricoid Pressure applied and Rapid sequence Ventilation: Mask ventilation without difficulty Laryngoscope Size: Mac and 3 Grade View: Grade II Tube type: Oral Tube size: 7.0 mm Number of attempts: 1 Airway Equipment and Method: Stylet and Oral airway Placement Confirmation: positive ETCO2 Secured at: 22 cm Tube secured with: Tape Dental Injury: Teeth and Oropharynx as per pre-operative assessment

## 2014-11-09 NOTE — Anesthesia Preprocedure Evaluation (Signed)
Anesthesia Evaluation  Patient identified by MRN, date of birth, ID band Patient awake    Reviewed: Allergy & Precautions, NPO status , Patient's Chart, lab work & pertinent test results, reviewed documented beta blocker date and time   Airway Mallampati: II  TM Distance: >3 FB     Dental  (+) Poor Dentition, Missing, Dental Advisory Given   Pulmonary Current Smoker,    breath sounds clear to auscultation       Cardiovascular hypertension, Pt. on medications and Pt. on home beta blockers  Rhythm:Regular Rate:Normal     Neuro/Psych PSYCHIATRIC DISORDERS Anxiety    GI/Hepatic GERD  Medicated and Poorly Controlled,  Endo/Other  Morbid obesity  Renal/GU      Musculoskeletal   Abdominal   Peds  Hematology   Anesthesia Other Findings   Reproductive/Obstetrics                             Anesthesia Physical Anesthesia Plan  ASA: III  Anesthesia Plan: General   Post-op Pain Management:    Induction: Intravenous, Rapid sequence and Cricoid pressure planned  Airway Management Planned: Oral ETT  Additional Equipment:   Intra-op Plan:   Post-operative Plan: Extubation in OR  Informed Consent: I have reviewed the patients History and Physical, chart, labs and discussed the procedure including the risks, benefits and alternatives for the proposed anesthesia with the patient or authorized representative who has indicated his/her understanding and acceptance.     Plan Discussed with:   Anesthesia Plan Comments:         Anesthesia Quick Evaluation

## 2014-11-09 NOTE — Interval H&P Note (Signed)
History and Physical Interval Note:  11/09/2014 8:44 AM  Caitlin Bond  has presented today for surgery, with the diagnosis of medial meniscus tear right knee and osteoarthritis  The various methods of treatment have been discussed with the patient and family. After consideration of risks, benefits and other options for treatment, the patient has consented to  Procedure(s): KNEE ARTHROSCOPY WITH MEDIAL MENISECTOMY (Right) as a surgical intervention .  The patient's history has been reviewed, patient examined, no change in status, stable for surgery.  I have reviewed the patient's chart and labs.  Questions were answered to the patient's satisfaction.     Arther Abbott

## 2014-11-09 NOTE — Transfer of Care (Signed)
Immediate Anesthesia Transfer of Care Note  Patient: Caitlin Bond  Procedure(s) Performed: Procedure(s): KNEE ARTHROSCOPY WITH MEDIAL MENISECTOMY AND LIMITED DEBRIDEMENT (Right)  Patient Location: PACU  Anesthesia Type:General  Level of Consciousness: awake, alert  and patient cooperative  Airway & Oxygen Therapy: Patient Spontanous Breathing and Patient connected to face mask oxygen  Post-op Assessment: Report given to RN, Post -op Vital signs reviewed and stable and Patient moving all extremities  Post vital signs: Reviewed and stable  Last Vitals:  Filed Vitals:   11/09/14 0835  BP: 122/79  Pulse:   Temp:   Resp: 13    Complications: No apparent anesthesia complications

## 2014-11-09 NOTE — Brief Op Note (Signed)
11/09/2014  10:20 AM  PATIENT:  Caitlin Bond  54 y.o. female  PRE-OPERATIVE DIAGNOSIS:  medial meniscus tear right knee and osteoarthritis  POST-OPERATIVE DIAGNOSIS:  medial meniscus tear right knee and osteoarthritis  PROCEDURE:  Procedure(s): KNEE ARTHROSCOPY WITH MEDIAL MENISECTOMY AND LIMITED DEBRIDEMENT (Right)   Operative findings torn medial meniscus right knee complex tear posterior horn and midbody with chondral changes grade 2 on the medial femoral condyle. Femoral and tibial bone spurs in the notch which were removed  Anterior cruciate ligament PCL intact  Lateral compartment free edge tear lateral meniscus which was debrided  Patellofemoral joint normal  Moderate synovitis  Surgeon Aline Brochure no assistants  Anesthesia Gen.  No blood loss  Marcaine epinephrine 60 mL injected into the joint postoperatively  No complications  No specimens   COUNTS:  YES  TOURNIQUET:    DICTATION: .Dragon Dictation  PLAN OF CARE: Discharge to home after PACU  PATIENT DISPOSITION:  PACU - hemodynamically stable.   Delay start of Pharmacological VTE agent (>24hrs) due to surgical blood loss or risk of bleeding: not applicable  Knee arthroscopy dictation  The patient was identified in the preoperative holding area using 2 approved identification mechanisms. The chart was reviewed and updated. The surgical site was confirmed and marked with an indelible marker.  The patient was taken to the operating room for anesthesia. After successful  GENERAL  anesthesia, 3GM ANCEF was used as IV antibiotics.  The patient was placed in the supine position with the (RIGHT KNEE) operative extremity in an arthroscopic leg holder and the opposite extremity in a padded leg holder.  The timeout was executed.  A lateral portal was established with an 11 blade and the scope was introduced into the joint. A diagnostic arthroscopy was performed in circumferential manner examining the entire  knee joint. A medial portal was established and the diagnostic arthroscopy was repeated using a probe to palpate intra-articular structures as they were encountered.   The operative findings are     The MEDIAL meniscus was resected using a duckbill forceps. The meniscal fragments were removed with a motorized shaver. The meniscus was balanced with a combination of a motorized shaver and a 50 ArthroCare wand until a stable rim was obtained.  The bowel to Bovie was used to remove osteophytes from the tibial surface and the femoral condylar surface which were blocking full extension.  The free edge tear of the lateral meniscus was debrided.  The arthroscopic pump was placed on the wash mode and any excess debris was removed from the joint using suction.  60 cc of Marcaine with epinephrine was injected through the arthroscope.  The portals were closed with 3-0 nylon suture.  A sterile bandage, Ace wrap and Cryo/Cuff was placed and the Cryo/Cuff was activated. The patient was taken to the recovery room in stable condition.

## 2014-11-09 NOTE — Discharge Instructions (Signed)
Operative findings HERE"S WHAT WE FOUND WRONG:   Several bone spurs were found in the knee joint remove those  The cartilage in YOUR  knee (meniscus) was torn and we remove the torn piece of tissue   Arthroscopy, With Meniscus Injury, Care After Refer to this sheet in the next few weeks. These instructions provide you with general information on caring for yourself after your procedure. Your health care provider may also give you specific instructions. Your treatment has been planned according to the current medical practices, but problems sometimes occur. Call your health care provider if you have any problems or questions after your procedure. WHAT TO EXPECT AFTER THE PROCEDURE After your procedure, it is typical to have the following:  Pain and swelling in your knee.  Constipation.  Difficulty walking. HOME CARE INSTRUCTIONS   Use crutches and do knee exercises as directed by your health care provider.  Apply ice to the injured area:  Put ice in a plastic bag.  Place a towel between your skin and the bag.  Leave the ice on for 15-20 minutes, 3-4 times a day while awake. Do this for the first 2 days.  Rest and raise (elevate) your knee.  Change bandages (dressings) as directed by your health care provider.  Keep the wound dry and clean. The wound may be washed gently with soap and water. Gently blot or dab the wound dry. It is okay to take showers 24-48 hours after surgery. Do not take baths, use swimming pools, or use hot tubs for 14 days, or as directed by your health care provider.  Only take over-the-counter or prescription medicines for pain, discomfort, or fever as directed by your health care provider.  Continue your normal diet as directed by your health care provider.  Do not lift anything more than 10 pounds or play contact sports for 3 weeks, or as directed by your health care provider.  If a brace was applied, use as directed by your health care  provider.  Your health care provider will help with instructions for rehabilitation of your knee. SEEK MEDICAL CARE IF:   You have increased bleeding (more than a small spot) from the wound.  You have redness, swelling, or increasing pain in the wound.  Yellowish-white fluid (pus) is coming from your wound. SEEK IMMEDIATE MEDICAL CARE IF:   You develop a rash.  You have a fever or persistent symptoms for more than 2-3 days.  You have difficulty breathing.  You have increasing pain with movement of the knee. MAKE SURE YOU:   Understand these instructions.  Will watch your condition.  Will get help right away if you are not doing well or get worse.   This information is not intended to replace advice given to you by your health care provider. Make sure you discuss any questions you have with your health care provider.   Document Released: 08/08/2004 Document Revised: 09/21/2012 Document Reviewed: 06/28/2012 Elsevier Interactive Patient Education 2016 [employer] PATIENT INSTRUCTIONS POST-ANESTHESIA  IMMEDIATELY FOLLOWING SURGERY:  Do not drive or operate machinery for the first twenty four hours after surgery.  Do not make any important decisions for twenty four hours after surgery or while taking narcotic pain medications or sedatives.  If you develop intractable nausea and vomiting or a severe headache please notify your doctor immediately.  FOLLOW-UP:  Please make an appointment with your surgeon as instructed. You do not need to follow up with anesthesia unless specifically instructed to do so.  WOUND CARE INSTRUCTIONS (if applicable):  Keep a dry clean dressing on the anesthesia/puncture wound site if there is drainage.  Once the wound has quit draining you may leave it open to air.  Generally you should leave the bandage intact for twenty four hours unless there is drainage.  If the epidural site drains for more than 36-48 hours please call the anesthesia  department.  QUESTIONS?:  Please feel free to call your physician or the hospital operator if you have any questions, and they will be happy to assist you.

## 2014-11-09 NOTE — Anesthesia Postprocedure Evaluation (Signed)
  Anesthesia Post-op Note  Patient: Caitlin Bond  Procedure(s) Performed: Procedure(s): KNEE ARTHROSCOPY WITH MEDIAL MENISECTOMY AND LIMITED DEBRIDEMENT (Right)  Patient Location: PACU  Anesthesia Type:General  Level of Consciousness: awake, alert , oriented and patient cooperative  Airway and Oxygen Therapy: Patient Spontanous Breathing  Post-op Pain: 2 /10, mild  Post-op Assessment: Post-op Vital signs reviewed, Patient's Cardiovascular Status Stable, Respiratory Function Stable, Patent Airway, No signs of Nausea or vomiting, Pain level controlled and No headache              Post-op Vital Signs: Reviewed and stable  Last Vitals:  Filed Vitals:   11/09/14 0835  BP: 122/79  Pulse:   Temp:   Resp: 13    Complications: No apparent anesthesia complications

## 2014-11-12 ENCOUNTER — Ambulatory Visit (INDEPENDENT_AMBULATORY_CARE_PROVIDER_SITE_OTHER): Payer: Medicaid Other | Admitting: Orthopedic Surgery

## 2014-11-12 ENCOUNTER — Encounter (HOSPITAL_COMMUNITY): Payer: Self-pay | Admitting: Orthopedic Surgery

## 2014-11-12 VITALS — BP 119/80 | Ht 67.0 in | Wt 266.0 lb

## 2014-11-12 DIAGNOSIS — Z9889 Other specified postprocedural states: Secondary | ICD-10-CM

## 2014-11-12 MED ORDER — HYDROCODONE-ACETAMINOPHEN 10-325 MG PO TABS: 1.0000 | ORAL_TABLET | Freq: Four times a day (QID) | ORAL | Status: DC | PRN

## 2014-11-12 NOTE — Progress Notes (Signed)
Chief Complaint  Patient presents with  . Follow-up    post op 1, [facility], DOS 11/09/14    Encounter Diagnosis  Name Primary?  . S/P arthroscopy of right knee Yes    Other than some stiffness and swelling she is doing good she is walking with a walker weight bear as tolerated knee flexion is 85  Recommend home exercise program with quadriceps exercises return in 3 weeks  She is on ibuprofen for pain basically. I did give her some hydrocodone in case she has a lot of discomfort but she is basically complaining of throbbing

## 2014-11-12 NOTE — Patient Instructions (Addendum)
Please perform the exercise program you have been given twice a day for the next 3 weeks

## 2014-12-03 ENCOUNTER — Ambulatory Visit (INDEPENDENT_AMBULATORY_CARE_PROVIDER_SITE_OTHER): Payer: Medicaid Other | Admitting: Orthopedic Surgery

## 2014-12-03 ENCOUNTER — Encounter: Payer: Self-pay | Admitting: Orthopedic Surgery

## 2014-12-03 VITALS — BP 123/80 | Ht 67.0 in | Wt 266.0 lb

## 2014-12-03 DIAGNOSIS — M1711 Unilateral primary osteoarthritis, right knee: Secondary | ICD-10-CM

## 2014-12-03 DIAGNOSIS — M129 Arthropathy, unspecified: Secondary | ICD-10-CM | POA: Diagnosis not present

## 2014-12-03 DIAGNOSIS — M23321 Other meniscus derangements, posterior horn of medial meniscus, right knee: Secondary | ICD-10-CM

## 2014-12-03 DIAGNOSIS — Z9889 Other specified postprocedural states: Secondary | ICD-10-CM

## 2014-12-03 NOTE — Progress Notes (Signed)
Chief Complaint  Patient presents with  . Follow-up    3 week follow up of right knee, [facility], DOS 11-09-14.   Postoperative visit #2 postop day 24 Status post arthroscopy right knee torn medial meniscus and osteoarthritis status post arthroscopy with medial meniscectomy limited debridement  She had chondral changes grade 2 in the medial femoral condyle and tibial and femoral bone spurs were removed from the notch we also debrided the free edge tear of the lateral meniscuS  HERE FOR POV   Today she complains of pain over the portal site and some clicking and popping on occasion. She says she has excellent pain relief she is walking better than she has a long time she says she thinks she has some stiffness  She does still lack full extension which was related to arthritis despite removing those bone spurs. She has no effusion she has some tenderness and swelling over the portal site without erythema  I went ahead and injected the portal sites  Verbal consent was obtained timeout executed to confirm right knee has injection site  Portal site injected with 3 mL of Depo-Medrol and lidocaine mixture 40 mg of Depakote and 2 mL 1% lidocaine.  Tolerated without called the patient  Follow-up 6 weeks she should continue her exercise program

## 2015-01-14 ENCOUNTER — Ambulatory Visit: Payer: Medicaid Other | Admitting: Orthopedic Surgery

## 2018-01-07 ENCOUNTER — Ambulatory Visit (HOSPITAL_COMMUNITY)
Admission: RE | Admit: 2018-01-07 | Discharge: 2018-01-07 | Disposition: A | Discharge status: Home / Self Care | Payer: Medicaid Other | Source: Ambulatory Visit | Attending: Internal Medicine | Admitting: Internal Medicine

## 2018-01-07 ENCOUNTER — Other Ambulatory Visit (HOSPITAL_COMMUNITY): Payer: Self-pay | Admitting: Internal Medicine

## 2018-01-07 DIAGNOSIS — M545 Low back pain, unspecified: Secondary | ICD-10-CM

## 2018-08-23 ENCOUNTER — Ambulatory Visit: Payer: Medicaid Other | Admitting: Orthopaedic Surgery

## 2018-08-23 ENCOUNTER — Encounter: Payer: Self-pay | Admitting: Orthopaedic Surgery

## 2018-08-23 ENCOUNTER — Other Ambulatory Visit: Payer: Self-pay

## 2018-08-23 ENCOUNTER — Telehealth: Payer: Self-pay | Admitting: Orthopaedic Surgery

## 2018-08-23 DIAGNOSIS — M5136 Other intervertebral disc degeneration, lumbar region: Secondary | ICD-10-CM

## 2018-08-23 NOTE — Progress Notes (Signed)
Subjective:    Patient ID: Caitlin Bond, female    DOB: 04-11-1960, 58 y.o.   MRN: [id number]  HPI She has a ten month history of lower back pain, midline to the right.  She is getting gradually worse. She has seen family doctor and has had prednisone, NSAIDs.  I have reviewed Dr. Angelica Ran notes.  She has seen Dr. Andres Labrum, chiropractor in [place].  I have reviewed his notes.  She has pain when first standing and cannot move for a few seconds then the pain goes away.  She went to the ER in December and had x-rays of the back after an episode that lasted a while.  I have reviewed those notes and x-rays. She is not getting better. She has no weakness, no trauma, no numbness.  I will get MRI of the lumbar spine, open unit.   Review of Systems  Constitutional: Positive for activity change.  Musculoskeletal: Positive for arthralgias, back pain and gait problem.  Psychiatric/Behavioral: The patient is nervous/anxious.   All other systems reviewed and are negative.  For Review of Systems, all other systems reviewed and are negative.  The following is a summary of the past history medically, past history surgically, known current medicines, social history and family history.  This information is gathered electronically by the computer from prior information and documentation.  I review this each visit and have found including this information at this point in the chart is beneficial and informative.   Past Medical History:  Diagnosis Date  . Anxiety   . Arthritis   . GERD (gastroesophageal reflux disease)   . Hypertension     Past Surgical History:  Procedure Laterality Date  . COLONOSCOPY N/A 09/12/2013   Procedure: COLONOSCOPY;  Surgeon: Daneil Dolin, MD;  Location: [facility];  Service: Endoscopy;  Laterality: N/A;  9:45  . KNEE ARTHROSCOPY WITH MEDIAL MENISECTOMY Right 11/09/2014   Procedure: KNEE ARTHROSCOPY WITH MEDIAL MENISECTOMY AND LIMITED DEBRIDEMENT;  Surgeon: Carole Civil, MD;  Location: [facility];  Service: Orthopedics;  Laterality: Right;  . URETHRAL DILATION     as child    Current Outpatient Medications on File Prior to Visit  Medication Sig Dispense Refill  . Cholecalciferol (VITAMIN D3) 400 UNITS CAPS Take 400 Units by mouth daily.    . diazepam (VALIUM) 10 MG tablet Take 10 mg by mouth every 6 (six) hours as needed for anxiety. 1/2 tablet    . HYDROcodone-acetaminophen (NORCO) 10-325 MG tablet Take 1 tablet by mouth every 6 (six) hours as needed. 84 tablet 0  . losartan-hydrochlorothiazide (HYZAAR) 50-12.5 MG per tablet Take 1 tablet by mouth daily.    . methocarbamol (ROBAXIN) 500 MG tablet Take 1 tablet (500 mg total) by mouth 2 (two) times daily. (Patient taking differently: Take 500 mg by mouth 2 (two) times daily as needed for muscle spasms. ) 20 tablet 0  . metoprolol succinate (TOPROL-XL) 25 MG 24 hr tablet Take 25 mg by mouth daily.    Marland Kitchen oxyCODONE-acetaminophen (ROXICET) 5-325 MG tablet Take 1 tablet by mouth every 4 (four) hours as needed for severe pain. (Patient not taking: Reported on 11/12/2014) 30 tablet 0  . promethazine (PHENERGAN) 12.5 MG tablet Take 1 tablet (12.5 mg total) by mouth every 6 (six) hours as needed for nausea or vomiting. 30 tablet 0  . ranitidine (ZANTAC) 150 MG capsule Take 150 mg by mouth 2 (two) times daily.    . vitamin B-12 (CYANOCOBALAMIN) 500 MCG  tablet Take 500 mcg by mouth daily.     No current facility-administered medications on file prior to visit.     Social History   Socioeconomic History  . Marital status: Married    Spouse name: Not on file  . Number of children: Not on file  . Years of education: Not on file  . Highest education level: Not on file  Occupational History  . Not on file  Social Needs  . Financial resource strain: Not on file  . Food insecurity    Worry: Not on file    Inability: Not on file  . Transportation needs    Medical: Not on file    Non-medical: Not on file   Tobacco Use  . Smoking status: Current Some Day Smoker    Packs/day: 0.10    Years: 30.00    Pack years: 3.00    Types: Cigarettes  . Smokeless tobacco: Never Used  Substance and Sexual Activity  . Alcohol use: Yes    Alcohol/week: 5.0 standard drinks    Types: 5 Shots of liquor per week    Comment: 5 per week  . Drug use: No  . Sexual activity: Not on file  Lifestyle  . Physical activity    Days per week: Not on file    Minutes per session: Not on file  . Stress: Not on file  Relationships  . Social Herbalist on phone: Not on file    Gets together: Not on file    Attends religious service: Not on file    Active member of club or organization: Not on file    Attends meetings of clubs or organizations: Not on file    Relationship status: Not on file  . Intimate partner violence    Fear of current or ex partner: Not on file    Emotionally abused: Not on file    Physically abused: Not on file    Forced sexual activity: Not on file  Other Topics Concern  . Not on file  Social History Narrative  . Not on file    Family History  Problem Relation Age of Onset  . Colon cancer Neg Hx     BP 118/86   Pulse 77   Ht 5\' 7"  (1.702 m)   Wt 257 lb (116.6 kg)   BMI 40.25 kg/m   Body mass index is 40.25 kg/m.     Objective:   Physical Exam Vitals signs reviewed.  Constitutional:      Appearance: She is well-developed.  HENT:     Head: Normocephalic and atraumatic.  Eyes:     Conjunctiva/sclera: Conjunctivae normal.     Pupils: Pupils are equal, round, and reactive to light.  Neck:     Musculoskeletal: Normal range of motion and neck supple.  Cardiovascular:     Rate and Rhythm: Normal rate and regular rhythm.  Pulmonary:     Effort: Pulmonary effort is normal.  Abdominal:     Palpations: Abdomen is soft.  Musculoskeletal:       Back:  Skin:    General: Skin is warm and dry.  Neurological:     Mental Status: She is alert and oriented to person,  place, and time.     Cranial Nerves: No cranial nerve deficit.     Motor: No abnormal muscle tone.     Coordination: Coordination normal.     Deep Tendon Reflexes: Reflexes are normal and symmetric. Reflexes normal.  Psychiatric:  Behavior: Behavior normal.        Thought Content: Thought content normal.        Judgment: Judgment normal.           Assessment & Plan:   Encounter Diagnosis  Name Primary?  . Other intervertebral disc degeneration, lumbar region    I will get MRI of the lumbar spine.  Continue present medicine.  Return after MRI.  Call if any problem.  Precautions discussed.   Electronically Signed Sanjuana Kava, MD 7/21/202010:37 AM

## 2018-08-23 NOTE — Telephone Encounter (Signed)
Patient requests to follow up with Dr. Aline Brochure after MRI.  I spoke to [place] regarding this and she said we could speak to Dr. Aline Brochure.  I called the patient and advise her that once MRI gets approved and this has been done, she is to call me and we will then see if Dr. Aline Brochure will do the follow up appointment.

## 2018-08-30 ENCOUNTER — Telehealth: Payer: Self-pay | Admitting: Radiology

## 2018-08-30 NOTE — Telephone Encounter (Signed)
Medicaid did not approve the MRI scan. I have called the patient to advise. She was told she needs to return to clinic in 6 weeks, then we can re order the MRI scan.   I made follow up appt for her in 6 weeks   To you FYI

## 2018-09-21 ENCOUNTER — Other Ambulatory Visit: Payer: Medicaid Other

## 2018-10-04 ENCOUNTER — Encounter: Payer: Self-pay | Admitting: Orthopaedic Surgery

## 2018-10-05 ENCOUNTER — Encounter: Payer: Self-pay | Admitting: Orthopaedic Surgery

## 2018-10-05 ENCOUNTER — Ambulatory Visit (INDEPENDENT_AMBULATORY_CARE_PROVIDER_SITE_OTHER): Payer: Medicaid Other | Admitting: Orthopaedic Surgery

## 2018-10-05 ENCOUNTER — Other Ambulatory Visit: Payer: Self-pay

## 2018-10-05 DIAGNOSIS — M5441 Lumbago with sciatica, right side: Secondary | ICD-10-CM | POA: Diagnosis not present

## 2018-10-05 DIAGNOSIS — G8929 Other chronic pain: Secondary | ICD-10-CM | POA: Diagnosis not present

## 2018-10-05 DIAGNOSIS — M5136 Other intervertebral disc degeneration, lumbar region: Secondary | ICD-10-CM

## 2018-10-05 NOTE — Progress Notes (Signed)
Virtual Visit via Telephone Note  I connected with@ on 10/05/18 at  9:40 AM EDT by telephone and verified that I am speaking with the correct person using two identifiers.  Location: Patient: home Provider: [facility]   I discussed the limitations, risks, security and privacy concerns of performing an evaluation and management service by telephone and the availability of in person appointments. I also discussed with the patient that there may be a patient responsible charge related to this service. The patient expressed understanding and agreed to proceed.   History of Present Illness: She has persistent lower back pain.  She is not getting any better.  She has pain running down to the right foot. She has no weakness, no trauma.  Most of her pain is in the mornings and after walking a distance.  She was seen in the ER in December for this. She has seen her family doctor.  She has seen a Curator.  She has done exercises at home.  She is not any better.  Her family doctor has had her on Motrin.  He has given her prednisone dose pack with little help. He has her on Neurontin now with little help.  I ordered a MRI recently and it was denied by Medicaid.  She has had conservative treatment, done her exercises, has been on NSAIDs with no help, has tried prednisone, has been on Neurontin and still has symptoms.  I am concerned about HNP.  I will order a new MRI of the lumbar spine and hope it is approved.  I do not know why she was denied for MRI.  She meets criteria.   She is tired of hurting all the time.   Observations/Objective: Per above.  Assessment and Plan: Encounter Diagnoses  Name Primary?  . Other intervertebral disc degeneration, lumbar region   . Chronic right-sided low back pain with right-sided sciatica Yes     Follow Up Instructions: Get MRI.  Return in one month or earlier if MRI is done before then.   I discussed the assessment and treatment plan with the  patient. The patient was provided an opportunity to ask questions and all were answered. The patient agreed with the plan and demonstrated an understanding of the instructions.   The patient was advised to call back or seek an in-person evaluation if the symptoms worsen or if the condition fails to improve as anticipated.  I provided 14 minutes of non-face-to-face time during this encounter.   Sanjuana Kava, MD

## 2018-10-06 ENCOUNTER — Ambulatory Visit: Payer: Medicaid Other | Admitting: Orthopaedic Surgery

## 2018-10-06 NOTE — Addendum Note (Signed)
Addended by: Derek Mound A on: 10/06/2018 03:36 PM   Modules accepted: Orders

## 2018-11-14 ENCOUNTER — Other Ambulatory Visit: Payer: Self-pay

## 2018-11-14 ENCOUNTER — Ambulatory Visit
Admission: RE | Admit: 2018-11-14 | Discharge: 2018-11-14 | Disposition: A | Discharge status: Home / Self Care | Payer: Medicaid Other | Source: Ambulatory Visit | Attending: Orthopaedic Surgery | Admitting: Orthopaedic Surgery

## 2018-11-14 DIAGNOSIS — M5136 Other intervertebral disc degeneration, lumbar region: Secondary | ICD-10-CM

## 2018-11-22 ENCOUNTER — Other Ambulatory Visit: Payer: Self-pay

## 2018-11-22 ENCOUNTER — Ambulatory Visit: Payer: Medicaid Other | Admitting: Orthopaedic Surgery

## 2018-11-22 ENCOUNTER — Encounter: Payer: Self-pay | Admitting: Orthopaedic Surgery

## 2018-11-22 VITALS — BP 133/82 | HR 65 | Ht 67.0 in | Wt 256.0 lb

## 2018-11-22 DIAGNOSIS — M5441 Lumbago with sciatica, right side: Secondary | ICD-10-CM | POA: Diagnosis not present

## 2018-11-22 DIAGNOSIS — G8929 Other chronic pain: Secondary | ICD-10-CM

## 2018-11-22 DIAGNOSIS — M5136 Other intervertebral disc degeneration, lumbar region: Secondary | ICD-10-CM | POA: Diagnosis not present

## 2018-11-22 NOTE — Progress Notes (Signed)
Patient [name], female DOB:1960/11/10, 58 y.o. [id number]  Chief Complaint  Patient presents with  . Back Pain  . Results    review MRI     HPI  Caitlin Bond is a 58 y.o. female who has continued lower back pain. She had MRI which showed: IMPRESSION: Mild non-compressive disc bulges from L1-2 through L4-5. Facet osteoarthritis in the lower lumbar spine, most pronounced at the L4-5 level. No compressive canal or foraminal stenosis. The facet arthritis could be a cause of back pain or referred facet syndrome Pain.  I have recommended epidural injection.  She wants to think about it.  Continue her Neurontin.   Body mass index is 40.1 kg/m.  ROS  Review of Systems  Constitutional: Positive for activity change.  Musculoskeletal: Positive for arthralgias, back pain and gait problem.  Psychiatric/Behavioral: The patient is nervous/anxious.   All other systems reviewed and are negative.   All other systems reviewed and are negative.  The following is a summary of the past history medically, past history surgically, known current medicines, social history and family history.  This information is gathered electronically by the computer from prior information and documentation.  I review this each visit and have found including this information at this point in the chart is beneficial and informative.    Past Medical History:  Diagnosis Date  . Anxiety   . Arthritis   . GERD (gastroesophageal reflux disease)   . Hypertension     Past Surgical History:  Procedure Laterality Date  . COLONOSCOPY N/A 09/12/2013   Procedure: COLONOSCOPY;  Surgeon: Daneil Dolin, MD;  Location: [facility];  Service: Endoscopy;  Laterality: N/A;  9:45  . KNEE ARTHROSCOPY WITH MEDIAL MENISECTOMY Right 11/09/2014   Procedure: KNEE ARTHROSCOPY WITH MEDIAL MENISECTOMY AND LIMITED DEBRIDEMENT;  Surgeon: Carole Civil, MD;  Location: [facility];  Service: Orthopedics;  Laterality: Right;   . URETHRAL DILATION     as child    Family History  Problem Relation Age of Onset  . Colon cancer Neg Hx     Social History Social History   Tobacco Use  . Smoking status: Current Some Day Smoker    Packs/day: 0.10    Years: 30.00    Pack years: 3.00    Types: Cigarettes  . Smokeless tobacco: Never Used  Substance Use Topics  . Alcohol use: Yes    Alcohol/week: 5.0 standard drinks    Types: 5 Shots of liquor per week    Comment: 5 per week  . Drug use: No    Allergies  Allergen Reactions  . Haldol [Haloperidol Lactate] Other (See Comments)    Eyes rolling back in head; neck stiffened.    Current Outpatient Medications  Medication Sig Dispense Refill  . Cholecalciferol (VITAMIN D3) 400 UNITS CAPS Take 400 Units by mouth daily.    . clonazePAM (KLONOPIN) 0.5 MG tablet Take 0.5 mg by mouth 2 (two) times daily as needed for anxiety.    . diazepam (VALIUM) 10 MG tablet Take 10 mg by mouth every 6 (six) hours as needed for anxiety. 1/2 tablet    . gabapentin (NEURONTIN) 300 MG capsule Take 300 mg by mouth 3 (three) times daily.    Marland Kitchen ibuprofen (ADVIL) 200 MG tablet Take 200 mg by mouth every 6 (six) hours as needed.    Marland Kitchen losartan-hydrochlorothiazide (HYZAAR) 50-12.5 MG per tablet Take 1 tablet by mouth daily.    . metoprolol succinate (TOPROL-XL) 25 MG 24  hr tablet Take 25 mg by mouth daily.    . ranitidine (ZANTAC) 150 MG capsule Take 150 mg by mouth 2 (two) times daily.    . vitamin B-12 (CYANOCOBALAMIN) 500 MCG tablet Take 500 mcg by mouth daily.    Marland Kitchen HYDROcodone-acetaminophen (NORCO) 10-325 MG tablet Take 1 tablet by mouth every 6 (six) hours as needed. (Patient not taking: Reported on 11/22/2018) 84 tablet 0   No current facility-administered medications for this visit.      Physical Exam  Blood pressure 133/82, pulse 65, height 5\' 7"  (1.702 m), weight 256 lb (116.1 kg).  Constitutional: overall normal hygiene, normal nutrition, well developed, normal grooming,  normal body habitus. Assistive device:none  Musculoskeletal: gait and station Limp none, muscle tone and strength are normal, no tremors or atrophy is present.  .  Neurological: coordination overall normal.  Deep tendon reflex/nerve stretch intact.  Sensation normal.  Cranial nerves II-XII intact.   Skin:   Normal overall no scars, lesions, ulcers or rashes. No psoriasis.  Psychiatric: Alert and oriented x 3.  Recent memory intact, remote memory unclear.  Normal mood and affect. Well groomed.  Good eye contact.  Cardiovascular: overall no swelling, no varicosities, no edema bilaterally, normal temperatures of the legs and arms, no clubbing, cyanosis and good capillary refill.  Lymphatic: palpation is normal.  Spine/Pelvis examination:  Inspection:  Overall, sacoiliac joint benign and hips nontender; without crepitus or defects.   Thoracic spine inspection: Alignment normal without kyphosis present   Lumbar spine inspection:  Alignment  with normal lumbar lordosis, without scoliosis apparent.   Thoracic spine palpation:  without tenderness of spinal processes   Lumbar spine palpation: without tenderness of lumbar area; without tightness of lumbar muscles    Range of Motion:   Lumbar flexion, forward flexion is normal without pain or tenderness    Lumbar extension is full without pain or tenderness   Left lateral bend is normal without pain or tenderness   Right lateral bend is normal without pain or tenderness   Straight leg raising is normal  Strength & tone: normal   Stability overall normal stability  All other systems reviewed and are negative   The patient has been educated about the nature of the problem(s) and counseled on treatment options.  The patient appeared to understand what I have discussed and is in agreement with it.  Encounter Diagnoses  Name Primary?  . Other intervertebral disc degeneration, lumbar region Yes  . Chronic right-sided low back pain with  right-sided sciatica     PLAN Call if any problems.  Precautions discussed.  Continue current medications.   Return to clinic 3 months   Electronically Signed Sanjuana Kava, MD 10/20/202011:27 AM

## 2018-11-24 ENCOUNTER — Telehealth: Payer: Self-pay | Admitting: Orthopaedic Surgery

## 2018-11-24 NOTE — Telephone Encounter (Signed)
Patient called to relay some concerns regarding her recent visit with Dr Luna Glasgow for her MRI results. Relays that she does not wish to follow up again with Dr Luna Glasgow.  Aware we are rounting to supervisor, Abigail Butts to contact patient to address her questions and concerns. Ph# [phone].

## 2018-11-30 NOTE — Telephone Encounter (Signed)
I called patient to discuss, and she does not want to see Dr Luna Glasgow again.   She would like to see you (you did her knee surgery previously- 2016).  She wants to come in to the office and have you review the MRI images with her, and tell her in detail what the results are.  Will that be ok to schedule her for an appt with you?  She is not interested in an epidural injection at this time, but if her symptoms flare again, she will let us know if she wants that referral.  Please advise on scheduling an appt with you.  Thanks.

## 2018-11-30 NOTE — Telephone Encounter (Signed)
I can handle this by phone (that's how I handle mri reports for back problems)

## 2018-12-02 NOTE — Telephone Encounter (Signed)
Virtual/Phone visit scheduled.

## 2018-12-07 ENCOUNTER — Other Ambulatory Visit: Payer: Self-pay

## 2018-12-07 ENCOUNTER — Ambulatory Visit (INDEPENDENT_AMBULATORY_CARE_PROVIDER_SITE_OTHER): Payer: Medicaid Other | Admitting: Orthopedic Surgery

## 2018-12-07 DIAGNOSIS — M5136 Other intervertebral disc degeneration, lumbar region: Secondary | ICD-10-CM | POA: Diagnosis not present

## 2018-12-07 NOTE — Progress Notes (Signed)
Virtual Visit via Telephone Note  Patient requested change of physician;   I connected with Caitlin Bond on 12/07/18 at 11:50 AM EST by telephone and verified that I am speaking with the correct person using two identifiers.  Location: Patient: Home Provider: Office   I discussed the limitations, risks, security and privacy concerns of performing an evaluation and management service by telephone and the availability of in person appointments. I also discussed with the patient that there may be a patient responsible charge related to this service. The patient expressed understanding and agreed to proceed.  Chief complaint: catching lower back with back pain    History of Present Illness: BACK PAIN, RADIATING, CATCHING W/ RELIEF BY LEANING OVER GROCERY CART.   SEEING PMD, GETTING INJECTIONS OF IM STEROIDS.   MOTRIN GABAPENTIN    Observations/Objective: N/A  MRI 01/11/2019 CLINICAL DATA:  Low back pain since 12/31/2017.   EXAM: LUMBAR SPINE - COMPLETE 4+ VIEW   COMPARISON:  Lumbar spine CT 06/06/2014   FINDINGS: There are 5 non rib-bearing lumbar type vertebrae. Vertebral alignment is unchanged and within normal limits. No fracture is identified. There's moderate disc space narrowing at L1-2 greater than L2-3 with mild narrowing at L3-4, similar to the prior CT. Endplate osteophyte formation is again seen at these levels. Mild multilevel facet arthrosis is present. Aortic calcification is noted.   IMPRESSION: 1. No acute osseous abnormality identified. 2. Mild-to-moderate lumbar spondylosis, similar to the prior CT.     Electronically Signed   By: Logan Bores M.D.   On: 01/10/2018 08:24   Assessment and Plan:  SPONDYLOSIS LUMBAR   Follow Up Instructions:  PT VS  ESI VS  SURGERY  FU PRN     I discussed the assessment and treatment plan with the patient. The patient was provided an opportunity to ask questions and all were answered. The patient  agreed with the plan and demonstrated an understanding of the instructions.   The patient was advised to call back or seek an in-person evaluation if the symptoms worsen or if the condition fails to improve as anticipated.  I provided 12 minutes of non-face-to-face time during this encounter.   Arther Abbott, MD

## 2018-12-07 NOTE — Addendum Note (Signed)
Addended byCandice Camp on: 12/07/2018 12:01 PM   Modules accepted: Orders

## 2018-12-09 ENCOUNTER — Ambulatory Visit: Payer: Medicaid Other | Admitting: Orthopedic Surgery

## 2019-02-23 ENCOUNTER — Ambulatory Visit: Payer: Medicaid Other | Admitting: Orthopaedic Surgery

## 2019-12-01 ENCOUNTER — Other Ambulatory Visit: Payer: Self-pay | Admitting: Orthopaedic Surgery

## 2019-12-01 DIAGNOSIS — G8929 Other chronic pain: Secondary | ICD-10-CM

## 2019-12-01 DIAGNOSIS — M5136 Other intervertebral disc degeneration, lumbar region: Secondary | ICD-10-CM

## 2020-08-26 ENCOUNTER — Encounter: Payer: Self-pay | Admitting: *Deleted

## 2021-04-11 ENCOUNTER — Other Ambulatory Visit (HOSPITAL_COMMUNITY): Payer: Self-pay | Admitting: Internal Medicine

## 2021-04-11 DIAGNOSIS — Z1231 Encounter for screening mammogram for malignant neoplasm of breast: Secondary | ICD-10-CM

## 2021-04-16 ENCOUNTER — Ambulatory Visit (HOSPITAL_COMMUNITY)
Admission: RE | Admit: 2021-04-16 | Discharge: 2021-04-16 | Disposition: A | Discharge status: Home / Self Care | Payer: Medicaid Other | Source: Ambulatory Visit | Attending: Internal Medicine | Admitting: Internal Medicine

## 2021-04-16 ENCOUNTER — Other Ambulatory Visit: Payer: Self-pay

## 2021-04-16 DIAGNOSIS — Z1231 Encounter for screening mammogram for malignant neoplasm of breast: Secondary | ICD-10-CM | POA: Insufficient documentation

## 2022-01-05 ENCOUNTER — Encounter: Payer: Self-pay | Admitting: Orthopedic Surgery

## 2022-01-05 ENCOUNTER — Ambulatory Visit: Payer: Medicaid Other | Admitting: Orthopedic Surgery

## 2022-01-05 VITALS — Ht 67.0 in | Wt 274.0 lb

## 2022-01-05 DIAGNOSIS — M25562 Pain in left knee: Secondary | ICD-10-CM

## 2022-01-05 DIAGNOSIS — M23322 Other meniscus derangements, posterior horn of medial meniscus, left knee: Secondary | ICD-10-CM | POA: Diagnosis not present

## 2022-01-05 NOTE — Progress Notes (Signed)
New problem last seen November 06, 2018  Previous arthroscopy right knee 2379  61 year old female presents with a 6-week history of pain on the medial side of her left knee with swelling and difficulty weightbearing she is lost extension and flexion has a joint effusion and a swollen left foot  She was seen by her primary care he took x-rays she does have 50% cartilage loss medial compartment but there is no acute fracture or dislocation  She did not really injure the knee but she tried to put her left foot onto her right knee for state and then 2 days later started having symptoms  She required 10 mg of oxycodone to get pain relief in the setting of chronic opioid therapy for back pain taking hydrocodone 06/05/2023 every 6  Physical Exam Vitals and nursing note reviewed.  Constitutional:      Appearance: Normal appearance.  HENT:     Head: Normocephalic and atraumatic.  Eyes:     General: No scleral icterus.       Right eye: No discharge.        Left eye: No discharge.     Extraocular Movements: Extraocular movements intact.     Conjunctiva/sclera: Conjunctivae normal.     Pupils: Pupils are equal, round, and reactive to light.  Cardiovascular:     Rate and Rhythm: Normal rate.     Pulses: Normal pulses.  Musculoskeletal:     Left knee: Swelling and effusion present. No deformity, erythema, ecchymosis, lacerations, bony tenderness or crepitus. Decreased range of motion. Tenderness present over the medial joint line. No LCL laxity, MCL laxity, ACL laxity or PCL laxity.Abnormal meniscus. Normal alignment and normal patellar mobility.     Instability Tests: Anterior drawer test negative. Posterior drawer test negative. Anterior Lachman test negative.  Skin:    General: Skin is warm and dry.     Capillary Refill: Capillary refill takes less than 2 seconds.  Neurological:     General: No focal deficit present.     Mental Status: She is alert and oriented to person, place, and time.   Psychiatric:        Mood and Affect: Mood normal.        Behavior: Behavior normal.        Thought Content: Thought content normal.        Judgment: Judgment normal.    Images reported via CD placed on media for further evaluations when needed mild to moderate OA 50% cartilage loss joint space narrowing grade 2 disease if not 3 disease without secondary bone changes  Assessment and plan  Encounter Diagnoses  Name Primary?   Acute pain of left knee Yes   Derangement of posterior horn of medial meniscus of left knee     unclear etiology most likely cause would be torn medial meniscus with a large joint effusion.  Does not appear to have DVT with no calf pain despite the foot swelling  Recommend MRI of the joint  Acute, outside imaging review, MRI ordered

## 2022-01-05 NOTE — Patient Instructions (Signed)
Call [phone] to schedule MRI while we work on getting it precerted with your insurance company

## 2022-01-07 ENCOUNTER — Telehealth: Payer: Self-pay | Admitting: Radiology

## 2022-01-07 NOTE — Telephone Encounter (Signed)
I can't pull up on Rad Md so I will need to call her [employer] Need the card for the number I Called patient She can send email of it if I send her email I emailed her at [email or web address]

## 2022-01-12 ENCOUNTER — Telehealth: Payer: Self-pay | Admitting: Orthopedic Surgery

## 2022-01-12 NOTE — Telephone Encounter (Signed)
Returned the patient's call to provider her with [facility] email.  She will resend it.

## 2022-01-13 ENCOUNTER — Telehealth: Payer: Self-pay | Admitting: Orthopedic Surgery

## 2022-01-13 NOTE — Telephone Encounter (Signed)
Patient wants to confirm that you received her insurance card in your email.  Phone # [phone]

## 2022-02-11 ENCOUNTER — Telehealth: Payer: Self-pay | Admitting: Orthopedic Surgery

## 2022-02-11 NOTE — Telephone Encounter (Signed)
Patient called, stated that she got a letter stating that prior approval for her MRI is good from 01/17/22 - 02/16/22 and her MRI is scheduled for 02/20/22, that was the earliest appointment they had.  She would like a call back.  Pt's # [phone]

## 2022-02-13 NOTE — Telephone Encounter (Signed)
Patient lvm following up on her previous call and would like a call back regarding her MRI.  Pt's # [phone]

## 2022-02-16 NOTE — Telephone Encounter (Signed)
Patient lvm regarding her MRI again, she would like a call back asap.  Pt's # [phone]

## 2022-02-16 NOTE — Telephone Encounter (Signed)
I talked to [employer], I need to get a new auth/change dates.  [email or web address], previous auth exp 02/16/22.

## 2022-02-20 ENCOUNTER — Ambulatory Visit (HOSPITAL_COMMUNITY)
Admission: RE | Admit: 2022-02-20 | Discharge: 2022-02-20 | Disposition: A | Discharge status: Home / Self Care | Payer: Medicaid Other | Source: Ambulatory Visit | Attending: Orthopedic Surgery | Admitting: Orthopedic Surgery

## 2022-02-20 DIAGNOSIS — M25562 Pain in left knee: Secondary | ICD-10-CM

## 2022-02-26 ENCOUNTER — Ambulatory Visit (INDEPENDENT_AMBULATORY_CARE_PROVIDER_SITE_OTHER): Payer: Medicaid Other | Admitting: Orthopedic Surgery

## 2022-02-26 DIAGNOSIS — M23322 Other meniscus derangements, posterior horn of medial meniscus, left knee: Secondary | ICD-10-CM | POA: Diagnosis not present

## 2022-02-26 NOTE — Progress Notes (Signed)
Chief Complaint  Patient presents with   Results    Review MRI left knee   Today's appointment is to review the MRI findings of the left knee.  62 year old female presented with 6 weeks history of pain on the medial side of her left knee with swelling and difficulty weightbearing.  She had some range of motion deficits as well she has x-rays that show medial compartment demise of about 50%  She still symptomatic in the left knee  I read her MRI and my reading I found the following  1.  Torn medial meniscus  2.  Osteoarthritis  Strain pattern MCL  I reviewed the findings with the patient since she is symptomatic I recommended she have arthroscopic surgery since she is unwilling to live with her knee as it is  She agreed to surgery she will call us back regarding the time

## 2022-02-26 NOTE — Patient Instructions (Addendum)
Meniscus Injury, Arthroscopy   Arthroscopy is a surgical procedure that involves the use of a small scope that has a camera and surgical instruments on the end (arthroscope). An arthroscope can be used to repair your meniscus injury.  LET [facility] CARE PROVIDER KNOW ABOUT: Any allergies you have. All medicines you are taking, including vitamins, herbs, eyedrops, creams, and over-the-counter medicines. Any recent colds or infections you have had or currently have. Previous problems you or members of your family have had with the use of anesthetics. Any blood disorders or blood clotting problems you have. Previous surgeries you have had. Medical conditions you have. RISKS AND COMPLICATIONS Generally, this is a safe procedure. However, as with any procedure, problems can occur. Possible problems include: Damage to nerves or blood vessels. Excess bleeding. Blood clots. Infection. BEFORE THE PROCEDURE Do not eat or drink for 6-8 hours before the procedure. Take medicines as directed by your surgeon. Ask your surgeon about changing or stopping your regular medicines. You may have lab tests the morning of surgery. PROCEDURE  You will be given one of the following:  A medicine that numbs the area (local anesthesia). A medicine that makes you go to sleep (general anesthesia). A medicine injected into your spine that numbs your body below the waist (spinal anesthesia). Most often, several small cuts (incisions) are made in the knee. The arthroscope and instruments go into the incisions to repair the damage. The torn portion of the meniscus is removed.   AFTER THE PROCEDURE You will be taken to the recovery area where your progress will be monitored. When you are awake, stable, and taking fluids without complications, you will be allowed to go home. This is usually the same day. A torn or stretched ligament (ligament sprain) may take 6-8 weeks to heal.  It takes about the 4-6 WEEKS if your  surgeon removed a torn meniscus. A repaired meniscus may require 6-12 weeks of recovery time. A torn ligament needing reconstructive surgery may take 6-12 months to heal fully.   This information is not intended to replace advice given to you by your health care provider. Make sure you discuss any questions you have with your health care provider. You have decided to proceed with operative arthroscopy of the knee. You have decided not to continue with nonoperative measures such as but not limited to oral medication, weight loss, activity modification, physical therapy, bracing, or injection.  We will perform operative arthroscopy of the knee. Some of the risks associated with arthroscopic surgery of the knee include but are not limited to Bleeding Infection Swelling Stiffness Blood clot Pain Need for knee replacement surgery    In compliance with recent [place] law in federal regulation regarding opioid use and abuse and addiction, we will taper (stop) opioid medication after 2 weeks.  If you're not comfortable with these risks and would like to continue with nonoperative treatment please let Dr. Aline Brochure know prior to your surgery.   Your surgery will be at [facility] by Dr Aline Brochure / call Amy when you are ready to schedule   The hospital will contact you with a preoperative appointment to discuss Anesthesia.  Please arrive on time or 15 minutes early for the preoperative appointment, they have a very tight schedule if you are late or do not come in your surgery will be cancelled.  The phone number is [phone]. Please bring your medications with you for the appointment. They will tell you the arrival time and  medication instructions when you have your preoperative evaluation. Do not wear nail polish the day of your surgery and if you take Phentermine you need to stop this medication ONE WEEK prior to your surgery. If you take Augustina Mood, Jardiance, or Steglatro) - Hold 72  hours before the procedure.  If you take Ozempic,  Bydureon or Trulicity do not take for 8 days before your surgery. If you take Victoza, Rybelsis, Saxenda or Adlyxi stop 24 hours before the procedure.  Please arrive at the hospital 2 hours before procedure if scheduled at 9:30 or later in the day or at the time the nurse tells you at your preoperative visit.   If you have my chart do not use the time given in my chart use the time given to you by the nurse during your preoperative visit.   Your surgery  time may change. Please be available for phone calls the day of your surgery and the day before. The Short Stay department may need to discuss changes about your surgery time. Not reaching the you could lead to procedure delays and possible cancellation.  You must have a ride home and someone to stay with you for 24 to 48 hours. The person taking you home will receive and sign for the your discharge instructions.  Please be prepared to give your support person's name and telephone number to Central Registration. Dr Aline Brochure will need that name and phone number post procedure.

## 2022-04-23 ENCOUNTER — Telehealth: Payer: Self-pay | Admitting: Orthopedic Surgery

## 2022-04-23 NOTE — Telephone Encounter (Signed)
Your note states  I reviewed the findings with the patient since she is symptomatic I recommended she have arthroscopic surgery since she is unwilling to live with her knee as it is   I will call her to schedule she wants in in June  Ok to post for SALK, medial meniscectomy ?

## 2022-04-23 NOTE — Telephone Encounter (Signed)
Dr. Aline Brochure patient - pt lvm stating she wants to schedule her knee surgery for the third week in June, she would like a call back [phone].

## 2022-04-28 NOTE — Telephone Encounter (Signed)
I just saw a pop up she is dismissed from department, disregard previous

## 2022-04-28 NOTE — Telephone Encounter (Signed)
Let me look into it

## 2022-05-14 ENCOUNTER — Other Ambulatory Visit (HOSPITAL_COMMUNITY): Payer: Self-pay | Admitting: Internal Medicine

## 2022-05-14 DIAGNOSIS — Z1231 Encounter for screening mammogram for malignant neoplasm of breast: Secondary | ICD-10-CM

## 2022-05-18 ENCOUNTER — Encounter (HOSPITAL_COMMUNITY): Payer: Self-pay

## 2022-05-18 ENCOUNTER — Ambulatory Visit (HOSPITAL_COMMUNITY)
Admission: RE | Admit: 2022-05-18 | Discharge: 2022-05-18 | Disposition: A | Discharge status: Home / Self Care | Payer: Medicaid Other | Source: Ambulatory Visit | Attending: Internal Medicine | Admitting: Internal Medicine

## 2022-05-18 DIAGNOSIS — Z1231 Encounter for screening mammogram for malignant neoplasm of breast: Secondary | ICD-10-CM | POA: Diagnosis present

## 2023-05-17 ENCOUNTER — Ambulatory Visit: Attending: Internal Medicine

## 2023-05-17 DIAGNOSIS — M79622 Pain in left upper arm: Secondary | ICD-10-CM | POA: Diagnosis present

## 2023-05-17 DIAGNOSIS — M6281 Muscle weakness (generalized): Secondary | ICD-10-CM | POA: Diagnosis present

## 2023-05-17 NOTE — Therapy (Signed)
 OUTPATIENT PHYSICAL THERAPY SHOULDER EVALUATION   Patient Name: Caitlin Bond MRN: [id number] DOB:Jul 15, 1960, 63 y.o., female Today's Date: 05/17/2023  END OF SESSION:  PT End of Session - 05/17/23 1437     Visit Number 1    Number of Visits 12    Date for PT Re-Evaluation 07/30/23    PT Start Time 1438    PT Stop Time 1510    PT Time Calculation (min) 32 min    Activity Tolerance Patient tolerated treatment well    Behavior During Therapy WFL for tasks assessed/performed             Past Medical History:  Diagnosis Date   Anxiety    Arthritis    GERD (gastroesophageal reflux disease)    Hypertension    Past Surgical History:  Procedure Laterality Date   COLONOSCOPY N/A 09/12/2013   Procedure: COLONOSCOPY;  Surgeon: Corbin Ade, MD;  Location: [facility];  Service: Endoscopy;  Laterality: N/A;  9:45   KNEE ARTHROSCOPY WITH MEDIAL MENISECTOMY Right 11/09/2014   Procedure: KNEE ARTHROSCOPY WITH MEDIAL MENISECTOMY AND LIMITED DEBRIDEMENT;  Surgeon: Vickki Hearing, MD;  Location: [facility] ORS;  Service: Orthopedics;  Laterality: Right;   URETHRAL DILATION     as child   Patient Active Problem List   Diagnosis Date Noted   Medial meniscus, posterior horn derangement    Acute meniscal tear of right knee 11/21/2013   Primary osteoarthritis of right knee 11/21/2013   Derangement of posterior horn of medial meniscus 11/21/2013   Heme positive stool 08/14/2013   REFERRING PROVIDER: Rebekah Chesterfield, NP   REFERRING DIAG: Pain in right arm   THERAPY DIAG:  Pain in left upper arm  Muscle weakness (generalized)  Rationale for Evaluation and Treatment: Rehabilitation  ONSET DATE: approximately 4 months ago  SUBJECTIVE:                                                                                                                                                                                      SUBJECTIVE STATEMENT: Patient reports that her left arm has been  bothering her for about 4 months with no known cause. She has tried to give it time to heal, but it has not changed any. She is really limited with reaching, lifting overhead, and washing her back.  Hand dominance: Right  PERTINENT HISTORY: OA, current smoker, anxiety, and hypertension  PAIN:  Are you having pain? Yes: NPRS scale: Current: 0/10 Worst: 9/10 Pain location: left biceps Pain description: intermittent sharp, grabbing pain (1-2 minutes)  Aggravating factors: reaching overhead and behind her back Relieving factors: heat, rest  PRECAUTIONS: None  RED FLAGS: None   WEIGHT BEARING RESTRICTIONS: No  FALLS:  Has patient fallen in last 6 months? No  LIVING ENVIRONMENT: Lives with: lives alone Lives in: House/apartment Has following equipment at home: None  OCCUPATION: Retired  PLOF: Independent  PATIENT GOALS: reduced pain, be able to reach overhead, and wash her back  NEXT MD VISIT: end of April 2025  OBJECTIVE:  Note: Objective measures were completed at Evaluation unless otherwise noted.  PATIENT SURVEYS:  Quick Dash 54.55  COGNITION: Overall cognitive status: Within functional limits for tasks assessed     SENSATION: Patient reports no numbness or tingling  POSTURE: Forward head  UPPER EXTREMITY ROM:   Active ROM Right eval Left eval  Shoulder flexion 136 106; limited by pain   Shoulder extension    Shoulder abduction 146 106; limited by pain  Shoulder adduction    Shoulder internal rotation To L2 To left gluteals  Shoulder external rotation To T2 To C7  Elbow flexion    Elbow extension    Wrist flexion    Wrist extension    Wrist ulnar deviation    Wrist radial deviation    Wrist pronation    Wrist supination    (Blank rows = not tested)  UPPER EXTREMITY MMT:  MMT Right eval Left eval  Shoulder flexion 4/5 4/5  Shoulder extension    Shoulder abduction 3+/5 3+/5; limited by pain   Shoulder adduction    Shoulder internal  rotation 4+/5 4/5  Shoulder external rotation 4/5 4-/5; limited by pain  Middle trapezius    Lower trapezius    Elbow flexion 4+/5 4-/5  Elbow extension 4+/5 4/5  Wrist flexion    Wrist extension    Wrist ulnar deviation    Wrist radial deviation    Wrist pronation    Wrist supination    Grip strength (lbs) 40 40  (Blank rows = not tested)  JOINT MOBILITY TESTING:   Left glenohumeral: hypomobile and familiar pain   PALPATION:  TTP: left infraspinatus and biceps                                                                                                                             TREATMENT DATE:    PATIENT EDUCATION: Education details: prognosis, anatomy, POC, and goals for physical therapy  Person educated: Patient Education method: Explanation Education comprehension: verbalized understanding  HOME EXERCISE PROGRAM:   ASSESSMENT:  CLINICAL IMPRESSION: Patient is a 63 y.o. female who was seen today for physical therapy evaluation and treatment for chronic left biceps pain.  She presented with low to moderate pain severity and irritability with left shoulder active range of motion being the most aggravating to her familiar symptoms.  Recommend that she continue with skilled physical therapy to address her impairments to return to her prior level of function.  OBJECTIVE IMPAIRMENTS: decreased activity tolerance, decreased ROM, decreased strength, hypomobility, impaired tone, impaired UE functional use, and pain.  ACTIVITY LIMITATIONS: carrying, lifting, bathing, dressing, and reach over head  PARTICIPATION LIMITATIONS: meal prep, cleaning, laundry, shopping, and community activity  PERSONAL FACTORS: Past/current experiences, Time since onset of injury/illness/exacerbation, and 3+ comorbidities: OA, current smoker, anxiety, and hypertension  are also affecting patient's functional outcome.   REHAB POTENTIAL: Good  CLINICAL DECISION MAKING: Evolving/moderate  complexity  EVALUATION COMPLEXITY: Moderate   GOALS: Goals reviewed with patient? Yes  SHORT TERM GOALS: Target date: 06/07/23  Patient will be independent with initial HEP. Baseline: Goal status: INITIAL  2.  Patient will be able to complete her daily activities without her familiar left shoulder pain exceeding 7/10. Baseline:  Goal status: INITIAL  3.  Patient will be able to demonstrate at least 118 degrees of left shoulder flexion for improved functional reaching overhead. Baseline:  Goal status: INITIAL  4.  Patient will be able to demonstrate at least 118 degrees of active left shoulder abduction for improved functional reaching overhead Baseline:  Goal status: INITIAL  5.  Patient will improve her QuickDASH score to 44 or less for improved perceived function with her daily activities. Baseline:  Goal status: INITIAL  LONG TERM GOALS: Target date: 06/28/23  Patient will be independent with her advanced HEP. Baseline:  Goal status: INITIAL  2.  Patient will be able to lift at least 5 pounds with her left upper extremity overhead for improved function putting away her dishes. Baseline:  Goal status: INITIAL  3.  Patient will be able to demonstrate at least 130 degrees of active left shoulder flexion for improved function with overhead activities. Baseline:  Goal status: INITIAL  4.  Patient will report being able to wash her back with her left upper extremity for improved functional mobility. Baseline:  Goal status: INITIAL  5.  Patient will improve her QuickDASH score to 34 or less for improved perceived function with her daily activities. Baseline:  Goal status: INITIAL  PLAN:  PT FREQUENCY: 2x/week  PT DURATION: 6 weeks  PLANNED INTERVENTIONS: 97164- PT Re-evaluation, 97750- Physical Performance Testing, 97110-Therapeutic exercises, 97530- Therapeutic activity, [id number]- Neuromuscular re-education, 97535- Self Care, 40981- Manual therapy, [id number]- Electrical  stimulation (unattended), 97016- Vasopneumatic device, Patient/Family education, Taping, Dry Needling, Joint mobilization, Cryotherapy, and Moist heat  PLAN FOR NEXT SESSION: pulleys, isometrics, manual therapy, PROM, and modalities as needed    Lane Pinon, PT 05/17/2023, 5:17 PM

## 2023-05-24 ENCOUNTER — Encounter: Admitting: Physical Therapy

## 2023-05-26 ENCOUNTER — Ambulatory Visit

## 2023-05-26 DIAGNOSIS — M79622 Pain in left upper arm: Secondary | ICD-10-CM | POA: Diagnosis not present

## 2023-05-26 DIAGNOSIS — M6281 Muscle weakness (generalized): Secondary | ICD-10-CM

## 2023-05-26 NOTE — Therapy (Signed)
 OUTPATIENT PHYSICAL THERAPY SHOULDER TREATMENT   Patient Name: Caitlin Bond MRN: [id number] DOB:1960-09-25, 63 y.o., female Today's Date: 05/26/2023  END OF SESSION:  PT End of Session - 05/26/23 1449     Visit Number 2    Number of Visits 12    Date for PT Re-Evaluation 07/30/23    PT Start Time 1440    PT Stop Time 1521    PT Time Calculation (min) 41 min    Activity Tolerance Patient tolerated treatment well    Behavior During Therapy [facility] for tasks assessed/performed             Past Medical History:  Diagnosis Date   Anxiety    Arthritis    GERD (gastroesophageal reflux disease)    Hypertension    Past Surgical History:  Procedure Laterality Date   COLONOSCOPY N/A 09/12/2013   Procedure: COLONOSCOPY;  Surgeon: Suzette Espy, MD;  Location: [facility];  Service: Endoscopy;  Laterality: N/A;  9:45   KNEE ARTHROSCOPY WITH MEDIAL MENISECTOMY Right 11/09/2014   Procedure: KNEE ARTHROSCOPY WITH MEDIAL MENISECTOMY AND LIMITED DEBRIDEMENT;  Surgeon: Darrin Emerald, MD;  Location: [facility];  Service: Orthopedics;  Laterality: Right;   URETHRAL DILATION     as child   Patient Active Problem List   Diagnosis Date Noted   Medial meniscus, posterior horn derangement    Acute meniscal tear of right knee 11/21/2013   Primary osteoarthritis of right knee 11/21/2013   Derangement of posterior horn of medial meniscus 11/21/2013   Heme positive stool 08/14/2013   REFERRING PROVIDER: Lenn Quint, NP   REFERRING DIAG: Pain in right arm   THERAPY DIAG:  Pain in left upper arm  Muscle weakness (generalized)  Rationale for Evaluation and Treatment: Rehabilitation  ONSET DATE: approximately 4 months ago  SUBJECTIVE:                                                                                                                                                                                      SUBJECTIVE STATEMENT: Patient reports that her arm does not hurt  at rest, but it will get up to a 9/10 when she uses it.  Hand dominance: Right  PERTINENT HISTORY: OA, current smoker, anxiety, and hypertension  PAIN:  Are you having pain? Yes: NPRS scale: Current: 0/10 Worst: 9/10 Pain location: left biceps Pain description: intermittent sharp, grabbing pain (1-2 minutes)  Aggravating factors: reaching overhead and behind her back Relieving factors: heat, rest  PRECAUTIONS: None  RED FLAGS: None   WEIGHT BEARING RESTRICTIONS: No  FALLS:  Has patient fallen in last 6 months? No  LIVING ENVIRONMENT:  Lives with: lives alone Lives in: House/apartment Has following equipment at home: None  OCCUPATION: Retired  PLOF: Independent  PATIENT GOALS: reduced pain, be able to reach overhead, and wash her back  NEXT MD VISIT: end of April 2025  OBJECTIVE:  Note: Objective measures were completed at Evaluation unless otherwise noted.  PATIENT SURVEYS:  Quick Dash 54.55  COGNITION: Overall cognitive status: Within functional limits for tasks assessed     SENSATION: Patient reports no numbness or tingling  POSTURE: Forward head  UPPER EXTREMITY ROM:   Active ROM Right eval Left eval  Shoulder flexion 136 106; limited by pain   Shoulder extension    Shoulder abduction 146 106; limited by pain  Shoulder adduction    Shoulder internal rotation To L2 To left gluteals  Shoulder external rotation To T2 To C7  Elbow flexion    Elbow extension    Wrist flexion    Wrist extension    Wrist ulnar deviation    Wrist radial deviation    Wrist pronation    Wrist supination    (Blank rows = not tested)  UPPER EXTREMITY MMT:  MMT Right eval Left eval  Shoulder flexion 4/5 4/5  Shoulder extension    Shoulder abduction 3+/5 3+/5; limited by pain   Shoulder adduction    Shoulder internal rotation 4+/5 4/5  Shoulder external rotation 4/5 4-/5; limited by pain  Middle trapezius    Lower trapezius    Elbow flexion 4+/5 4-/5  Elbow  extension 4+/5 4/5  Wrist flexion    Wrist extension    Wrist ulnar deviation    Wrist radial deviation    Wrist pronation    Wrist supination    Grip strength (lbs) 40 40  (Blank rows = not tested)  JOINT MOBILITY TESTING:   Left glenohumeral: hypomobile and familiar pain   PALPATION:  TTP: left infraspinatus and biceps                                                                                                                             TREATMENT DATE:                                    05/26/23 EXERCISE LOG  Exercise Repetitions and Resistance Comments  Pulleys 7 minutes  Flexion  UE ranger (seated) 3 minutes  AP and circles  L shoulder ADD isometric  3 minutes w/ 5 second hold   Therabar bending Red t-bar x 2 minutes each  Up and down  Wall slides  20 reps    Isometric ball squeeze  3 minutes w/ 5 second hold For shoulder IR   AA cane ER  4 minutes     Blank cell = exercise not performed today   PATIENT EDUCATION: Education details: HEP, plan of care, and healing Person educated: Patient Education method: Explanation Education comprehension: verbalized  understanding  HOME EXERCISE PROGRAM:   ASSESSMENT:  CLINICAL IMPRESSION: Patient was introduced to multiple new interventions for improved left shoulder mobility needed for functional activities. She required minimal cueing with today's isometric interventions for proper exercise performance to facilitate appropriate muscular engagement. She experienced a mild increase in left shoulder soreness with today's interventions, but this did not limit her ability to complete any of today's interventions. She reported that her arm felt "a little better" upon the conclusion of treatment. She continues to require skilled physical therapy to address her remaining impairments to return to her prior level of function.   OBJECTIVE IMPAIRMENTS: decreased activity tolerance, decreased ROM, decreased strength, hypomobility, impaired  tone, impaired UE functional use, and pain.   ACTIVITY LIMITATIONS: carrying, lifting, bathing, dressing, and reach over head  PARTICIPATION LIMITATIONS: meal prep, cleaning, laundry, shopping, and community activity  PERSONAL FACTORS: Past/current experiences, Time since onset of injury/illness/exacerbation, and 3+ comorbidities: OA, current smoker, anxiety, and hypertension  are also affecting patient's functional outcome.   REHAB POTENTIAL: Good  CLINICAL DECISION MAKING: Evolving/moderate complexity  EVALUATION COMPLEXITY: Moderate   GOALS: Goals reviewed with patient? Yes  SHORT TERM GOALS: Target date: 06/07/23  Patient will be independent with initial HEP. Baseline: Goal status: INITIAL  2.  Patient will be able to complete her daily activities without her familiar left shoulder pain exceeding 7/10. Baseline:  Goal status: INITIAL  3.  Patient will be able to demonstrate at least 118 degrees of left shoulder flexion for improved functional reaching overhead. Baseline:  Goal status: INITIAL  4.  Patient will be able to demonstrate at least 118 degrees of active left shoulder abduction for improved functional reaching overhead Baseline:  Goal status: INITIAL  5.  Patient will improve her QuickDASH score to 44 or less for improved perceived function with her daily activities. Baseline:  Goal status: INITIAL  LONG TERM GOALS: Target date: 06/28/23  Patient will be independent with her advanced HEP. Baseline:  Goal status: INITIAL  2.  Patient will be able to lift at least 5 pounds with her left upper extremity overhead for improved function putting away her dishes. Baseline:  Goal status: INITIAL  3.  Patient will be able to demonstrate at least 130 degrees of active left shoulder flexion for improved function with overhead activities. Baseline:  Goal status: INITIAL  4.  Patient will report being able to wash her back with her left upper extremity for improved  functional mobility. Baseline:  Goal status: INITIAL  5.  Patient will improve her QuickDASH score to 34 or less for improved perceived function with her daily activities. Baseline:  Goal status: INITIAL  PLAN:  PT FREQUENCY: 2x/week  PT DURATION: 6 weeks  PLANNED INTERVENTIONS: 97164- PT Re-evaluation, 97750- Physical Performance Testing, 97110-Therapeutic exercises, 97530- Therapeutic activity, [id number]- Neuromuscular re-education, 97535- Self Care, [id number]- Manual therapy, [id number]- Electrical stimulation (unattended), 97016- Vasopneumatic device, Patient/Family education, Taping, Dry Needling, Joint mobilization, Cryotherapy, and Moist heat  PLAN FOR NEXT SESSION: pulleys, isometrics, manual therapy, PROM, and modalities as needed    Lane Pinon, PT 05/26/2023, 3:34 PM

## 2023-05-31 ENCOUNTER — Other Ambulatory Visit (HOSPITAL_COMMUNITY): Payer: Self-pay | Admitting: Internal Medicine

## 2023-05-31 DIAGNOSIS — Z1231 Encounter for screening mammogram for malignant neoplasm of breast: Secondary | ICD-10-CM

## 2023-06-03 ENCOUNTER — Ambulatory Visit: Attending: Internal Medicine

## 2023-06-03 DIAGNOSIS — M6281 Muscle weakness (generalized): Secondary | ICD-10-CM | POA: Diagnosis present

## 2023-06-03 DIAGNOSIS — M79622 Pain in left upper arm: Secondary | ICD-10-CM | POA: Insufficient documentation

## 2023-06-03 NOTE — Therapy (Signed)
 OUTPATIENT PHYSICAL THERAPY SHOULDER TREATMENT   Patient Name: Caitlin Bond MRN: [id number] DOB:1960/03/22, 63 y.o., female Today's Date: 06/03/2023  END OF SESSION:  PT End of Session - 06/03/23 1447     Visit Number 3    Number of Visits 12    Date for PT Re-Evaluation 07/30/23    PT Start Time 1430    PT Stop Time 1513    PT Time Calculation (min) 43 min    Activity Tolerance Patient tolerated treatment well    Behavior During Therapy WFL for tasks assessed/performed              Past Medical History:  Diagnosis Date   Anxiety    Arthritis    GERD (gastroesophageal reflux disease)    Hypertension    Past Surgical History:  Procedure Laterality Date   COLONOSCOPY N/A 09/12/2013   Procedure: COLONOSCOPY;  Surgeon: Suzette Espy, MD;  Location: [facility];  Service: Endoscopy;  Laterality: N/A;  9:45   KNEE ARTHROSCOPY WITH MEDIAL MENISECTOMY Right 11/09/2014   Procedure: KNEE ARTHROSCOPY WITH MEDIAL MENISECTOMY AND LIMITED DEBRIDEMENT;  Surgeon: Darrin Emerald, MD;  Location: [facility];  Service: Orthopedics;  Laterality: Right;   URETHRAL DILATION     as child   Patient Active Problem List   Diagnosis Date Noted   Medial meniscus, posterior horn derangement    Acute meniscal tear of right knee 11/21/2013   Primary osteoarthritis of right knee 11/21/2013   Derangement of posterior horn of medial meniscus 11/21/2013   Heme positive stool 08/14/2013   REFERRING PROVIDER: Lenn Quint, NP   REFERRING DIAG: Pain in right arm   THERAPY DIAG:  Pain in left upper arm  Muscle weakness (generalized)  Rationale for Evaluation and Treatment: Rehabilitation  ONSET DATE: approximately 4 months ago  SUBJECTIVE:                                                                                                                                                                                      SUBJECTIVE STATEMENT: Patient reports that her arm is  throbbing. Hand dominance: Right  PERTINENT HISTORY: OA, current smoker, anxiety, and hypertension  PAIN:  Are you having pain? Yes: NPRS scale: Current: 9/10 Worst: 9/10 Pain location: left biceps Pain description: intermittent sharp, grabbing pain (1-2 minutes)  Aggravating factors: reaching overhead and behind her back Relieving factors: heat, rest  PRECAUTIONS: None  RED FLAGS: None   WEIGHT BEARING RESTRICTIONS: No  FALLS:  Has patient fallen in last 6 months? No  LIVING ENVIRONMENT: Lives with: lives alone Lives in: House/apartment Has following equipment at home: None  OCCUPATION:  Retired  PLOF: Independent  PATIENT GOALS: reduced pain, be able to reach overhead, and wash her back  NEXT MD VISIT: end of April 2025  OBJECTIVE:  Note: Objective measures were completed at Evaluation unless otherwise noted.  PATIENT SURVEYS:  Quick Dash 54.55  COGNITION: Overall cognitive status: Within functional limits for tasks assessed     SENSATION: Patient reports no numbness or tingling  POSTURE: Forward head  UPPER EXTREMITY ROM:   Active ROM Right eval Left eval  Shoulder flexion 136 106; limited by pain   Shoulder extension    Shoulder abduction 146 106; limited by pain  Shoulder adduction    Shoulder internal rotation To L2 To left gluteals  Shoulder external rotation To T2 To C7  Elbow flexion    Elbow extension    Wrist flexion    Wrist extension    Wrist ulnar deviation    Wrist radial deviation    Wrist pronation    Wrist supination    (Blank rows = not tested)  UPPER EXTREMITY MMT:  MMT Right eval Left eval  Shoulder flexion 4/5 4/5  Shoulder extension    Shoulder abduction 3+/5 3+/5; limited by pain   Shoulder adduction    Shoulder internal rotation 4+/5 4/5  Shoulder external rotation 4/5 4-/5; limited by pain  Middle trapezius    Lower trapezius    Elbow flexion 4+/5 4-/5  Elbow extension 4+/5 4/5  Wrist flexion    Wrist  extension    Wrist ulnar deviation    Wrist radial deviation    Wrist pronation    Wrist supination    Grip strength (lbs) 40 40  (Blank rows = not tested)  JOINT MOBILITY TESTING:   Left glenohumeral: hypomobile and familiar pain   PALPATION:  TTP: left infraspinatus and biceps                                                                                                                             TREATMENT DATE:                                    06/03/23 EXERCISE LOG  Exercise Repetitions and Resistance Comments  Pulleys  7.5 minutes  Flexion  UE ranger  3.5 minutes Multidirectional  Bilateral shoulder ER  Red t-band x 3 minutes   Therabar bending  Red t-bar x 3.5 minutes  Alternating every 10 reps (up and down)   L shoulder extension isometric  3 minutes w/ 5 second hold    Functional IR with strap  3 minutes w/ 5 second hold  To midline   Therabar twisting  Red t-bar x 2 minutes  With arms extended  UBE  6 minutes @ 120 RPM    Blank cell = exercise not performed today  05/26/23 EXERCISE LOG  Exercise Repetitions and Resistance Comments  Pulleys 7 minutes  Flexion  UE ranger (seated) 3 minutes  AP and circles  L shoulder ADD isometric  3 minutes w/ 5 second hold   Therabar bending Red t-bar x 2 minutes each  Up and down  Wall slides  20 reps    Isometric ball squeeze  3 minutes w/ 5 second hold For shoulder IR   AA cane ER  4 minutes     Blank cell = exercise not performed today   PATIENT EDUCATION: Education details: HEP and healing Person educated: Patient Education method: Explanation Education comprehension: verbalized understanding  HOME EXERCISE PROGRAM:   ASSESSMENT:  CLINICAL IMPRESSION: Patient was progressed with multiple new and familiar interventions for improved left shoulder functional mobility. She required minimal cueing with today's new interventions for proper exercise performance needed to facilitate proper  muscular engagement. She experienced no significant increase in pain or discomfort with any of today's interventions. She reported feeling a lot better as she was not hurting upon the conclusion of treatment. She continues to require skilled physical therapy to address her remaining impairments to return to her prior level of function.   OBJECTIVE IMPAIRMENTS: decreased activity tolerance, decreased ROM, decreased strength, hypomobility, impaired tone, impaired UE functional use, and pain.   ACTIVITY LIMITATIONS: carrying, lifting, bathing, dressing, and reach over head  PARTICIPATION LIMITATIONS: meal prep, cleaning, laundry, shopping, and community activity  PERSONAL FACTORS: Past/current experiences, Time since onset of injury/illness/exacerbation, and 3+ comorbidities: OA, current smoker, anxiety, and hypertension  are also affecting patient's functional outcome.   REHAB POTENTIAL: Good  CLINICAL DECISION MAKING: Evolving/moderate complexity  EVALUATION COMPLEXITY: Moderate   GOALS: Goals reviewed with patient? Yes  SHORT TERM GOALS: Target date: 06/07/23  Patient will be independent with initial HEP. Baseline: Goal status: INITIAL  2.  Patient will be able to complete her daily activities without her familiar left shoulder pain exceeding 7/10. Baseline:  Goal status: INITIAL  3.  Patient will be able to demonstrate at least 118 degrees of left shoulder flexion for improved functional reaching overhead. Baseline:  Goal status: INITIAL  4.  Patient will be able to demonstrate at least 118 degrees of active left shoulder abduction for improved functional reaching overhead Baseline:  Goal status: INITIAL  5.  Patient will improve her QuickDASH score to 44 or less for improved perceived function with her daily activities. Baseline:  Goal status: INITIAL  LONG TERM GOALS: Target date: 06/28/23  Patient will be independent with her advanced HEP. Baseline:  Goal status:  INITIAL  2.  Patient will be able to lift at least 5 pounds with her left upper extremity overhead for improved function putting away her dishes. Baseline:  Goal status: INITIAL  3.  Patient will be able to demonstrate at least 130 degrees of active left shoulder flexion for improved function with overhead activities. Baseline:  Goal status: INITIAL  4.  Patient will report being able to wash her back with her left upper extremity for improved functional mobility. Baseline:  Goal status: INITIAL  5.  Patient will improve her QuickDASH score to 34 or less for improved perceived function with her daily activities. Baseline:  Goal status: INITIAL  PLAN:  PT FREQUENCY: 2x/week  PT DURATION: 6 weeks  PLANNED INTERVENTIONS: 97164- PT Re-evaluation, 97750- Physical Performance Testing, 97110-Therapeutic exercises, 97530- Therapeutic activity, [id number]- Neuromuscular re-education, 97535- Self Care, [id number]- Manual therapy, [id number]- Electrical stimulation (unattended), 97016- Vasopneumatic device, Patient/Family education,  Taping, Dry Needling, Joint mobilization, Cryotherapy, and Moist heat  PLAN FOR NEXT SESSION: pulleys, isometrics, manual therapy, PROM, and modalities as needed    Lane Pinon, PT 06/03/2023, 3:42 PM

## 2023-06-10 ENCOUNTER — Ambulatory Visit

## 2023-06-10 DIAGNOSIS — M79622 Pain in left upper arm: Secondary | ICD-10-CM | POA: Diagnosis not present

## 2023-06-10 DIAGNOSIS — M6281 Muscle weakness (generalized): Secondary | ICD-10-CM

## 2023-06-10 NOTE — Therapy (Signed)
 OUTPATIENT PHYSICAL THERAPY SHOULDER TREATMENT   Patient Name: Caitlin Bond MRN: [id number] DOB:Jun 10, 1960, 63 y.o., female Today's Date: 06/10/2023  END OF SESSION:  PT End of Session - 06/10/23 1436     Visit Number 4    Number of Visits 12    Date for PT Re-Evaluation 07/30/23    PT Start Time 1434    PT Stop Time 1515    PT Time Calculation (min) 41 min    Activity Tolerance Patient tolerated treatment well    Behavior During Therapy [facility] for tasks assessed/performed               Past Medical History:  Diagnosis Date   Anxiety    Arthritis    GERD (gastroesophageal reflux disease)    Hypertension    Past Surgical History:  Procedure Laterality Date   COLONOSCOPY N/A 09/12/2013   Procedure: COLONOSCOPY;  Surgeon: Suzette Espy, MD;  Location: [facility];  Service: Endoscopy;  Laterality: N/A;  9:45   KNEE ARTHROSCOPY WITH MEDIAL MENISECTOMY Right 11/09/2014   Procedure: KNEE ARTHROSCOPY WITH MEDIAL MENISECTOMY AND LIMITED DEBRIDEMENT;  Surgeon: Darrin Emerald, MD;  Location: [facility];  Service: Orthopedics;  Laterality: Right;   URETHRAL DILATION     as child   Patient Active Problem List   Diagnosis Date Noted   Medial meniscus, posterior horn derangement    Acute meniscal tear of right knee 11/21/2013   Primary osteoarthritis of right knee 11/21/2013   Derangement of posterior horn of medial meniscus 11/21/2013   Heme positive stool 08/14/2013   REFERRING PROVIDER: Lenn Quint, NP   REFERRING DIAG: Pain in right arm   THERAPY DIAG:  Pain in left upper arm  Muscle weakness (generalized)  Rationale for Evaluation and Treatment: Rehabilitation  ONSET DATE: approximately 4 months ago  SUBJECTIVE:                                                                                                                                                                                      SUBJECTIVE STATEMENT: Patient reports that her arm really hurts  when she moves it, but it feels better for a few hours after physical therapy.  Hand dominance: Right  PERTINENT HISTORY: OA, current smoker, anxiety, and hypertension  PAIN:  Are you having pain? Yes: NPRS scale: Current: 9/10 Worst: 9/10 Pain location: left biceps Pain description: intermittent sharp, grabbing pain (1-2 minutes)  Aggravating factors: reaching overhead and behind her back Relieving factors: heat, rest  PRECAUTIONS: None  RED FLAGS: None   WEIGHT BEARING RESTRICTIONS: No  FALLS:  Has patient fallen in last 6 months? No  LIVING ENVIRONMENT: Lives with: lives alone Lives in: House/apartment Has following equipment at home: None  OCCUPATION: Retired  PLOF: Independent  PATIENT GOALS: reduced pain, be able to reach overhead, and wash her back  NEXT MD VISIT: end of April 2025  OBJECTIVE:  Note: Objective measures were completed at Evaluation unless otherwise noted.  PATIENT SURVEYS:  Quick Dash 54.55  COGNITION: Overall cognitive status: Within functional limits for tasks assessed     SENSATION: Patient reports no numbness or tingling  POSTURE: Forward head  UPPER EXTREMITY ROM:   Active ROM Right eval Left eval  Shoulder flexion 136 106; limited by pain   Shoulder extension    Shoulder abduction 146 106; limited by pain  Shoulder adduction    Shoulder internal rotation To L2 To left gluteals  Shoulder external rotation To T2 To C7  Elbow flexion    Elbow extension    Wrist flexion    Wrist extension    Wrist ulnar deviation    Wrist radial deviation    Wrist pronation    Wrist supination    (Blank rows = not tested)  UPPER EXTREMITY MMT:  MMT Right eval Left eval  Shoulder flexion 4/5 4/5  Shoulder extension    Shoulder abduction 3+/5 3+/5; limited by pain   Shoulder adduction    Shoulder internal rotation 4+/5 4/5  Shoulder external rotation 4/5 4-/5; limited by pain  Middle trapezius    Lower trapezius    Elbow  flexion 4+/5 4-/5  Elbow extension 4+/5 4/5  Wrist flexion    Wrist extension    Wrist ulnar deviation    Wrist radial deviation    Wrist pronation    Wrist supination    Grip strength (lbs) 40 40  (Blank rows = not tested)  JOINT MOBILITY TESTING:   Left glenohumeral: hypomobile and familiar pain   PALPATION:  TTP: left infraspinatus and biceps                                                                                                                             TREATMENT DATE:                                    06/10/23 EXERCISE LOG  Exercise Repetitions and Resistance Comments  Pulleys  6 minutes  Flexion   UE ranger (seated) 4.5 minutes Multidirectional   UBE  8 minutes @ 120 RPM    Resisted row  Green t-band x 3 minutes   Therabar bending  Red t-bar x 2.5 minutes each Up and down   Horizontal ABD  Red t-band x 2 minutes   Bicep curl  4# x 30 reps each With forearms supinated and neutral   Therabar twisting  Red t-bar x 2 minutes    Blank cell = exercise not performed today  06/03/23 EXERCISE LOG  Exercise Repetitions and Resistance Comments  Pulleys  7.5 minutes  Flexion  UE ranger  3.5 minutes Multidirectional  Bilateral shoulder ER  Red t-band x 3 minutes   Therabar bending  Red t-bar x 3.5 minutes  Alternating every 10 reps (up and down)   L shoulder extension isometric  3 minutes w/ 5 second hold    Functional IR with strap  3 minutes w/ 5 second hold  To midline   Therabar twisting  Red t-bar x 2 minutes  With arms extended  UBE  6 minutes @ 120 RPM    Blank cell = exercise not performed today                                    05/26/23 EXERCISE LOG  Exercise Repetitions and Resistance Comments  Pulleys 7 minutes  Flexion  UE ranger (seated) 3 minutes  AP and circles  L shoulder ADD isometric  3 minutes w/ 5 second hold   Therabar bending Red t-bar x 2 minutes each  Up and down  Wall slides  20 reps    Isometric ball  squeeze  3 minutes w/ 5 second hold For shoulder IR   AA cane ER  4 minutes     Blank cell = exercise not performed today   PATIENT EDUCATION: Education details: HEP and healing Person educated: Patient Education method: Explanation Education comprehension: verbalized understanding  HOME EXERCISE PROGRAM:   ASSESSMENT:  CLINICAL IMPRESSION: Patient was progressed with resisted rows and other familiar interventions for improved left shoulder mobility muscular strength. She required minimal cueing with today's new interventions for proper exercise performance. She experienced no increase in pain or discomfort with any of today's interventions. She reported feeling better upon the conclusion of treatment. She continues to require skilled physical therapy to address her remaining impairments to return to her prior level of function.   OBJECTIVE IMPAIRMENTS: decreased activity tolerance, decreased ROM, decreased strength, hypomobility, impaired tone, impaired UE functional use, and pain.   ACTIVITY LIMITATIONS: carrying, lifting, bathing, dressing, and reach over head  PARTICIPATION LIMITATIONS: meal prep, cleaning, laundry, shopping, and community activity  PERSONAL FACTORS: Past/current experiences, Time since onset of injury/illness/exacerbation, and 3+ comorbidities: OA, current smoker, anxiety, and hypertension are also affecting patient's functional outcome.   REHAB POTENTIAL: Good  CLINICAL DECISION MAKING: Evolving/moderate complexity  EVALUATION COMPLEXITY: Moderate   GOALS: Goals reviewed with patient? Yes  SHORT TERM GOALS: Target date: 06/07/23  Patient will be independent with initial HEP. Baseline: Goal status: INITIAL  2.  Patient will be able to complete her daily activities without her familiar left shoulder pain exceeding 7/10. Baseline:  Goal status: INITIAL  3.  Patient will be able to demonstrate at least 118 degrees of left shoulder flexion for improved  functional reaching overhead. Baseline:  Goal status: INITIAL  4.  Patient will be able to demonstrate at least 118 degrees of active left shoulder abduction for improved functional reaching overhead Baseline:  Goal status: INITIAL  5.  Patient will improve her QuickDASH score to 44 or less for improved perceived function with her daily activities. Baseline:  Goal status: INITIAL  LONG TERM GOALS: Target date: 06/28/23  Patient will be independent with her advanced HEP. Baseline:  Goal status: INITIAL  2.  Patient will be able to lift at least 5 pounds with her left upper extremity overhead for improved  function putting away her dishes. Baseline:  Goal status: INITIAL  3.  Patient will be able to demonstrate at least 130 degrees of active left shoulder flexion for improved function with overhead activities. Baseline:  Goal status: INITIAL  4.  Patient will report being able to wash her back with her left upper extremity for improved functional mobility. Baseline:  Goal status: INITIAL  5.  Patient will improve her QuickDASH score to 34 or less for improved perceived function with her daily activities. Baseline:  Goal status: INITIAL  PLAN:  PT FREQUENCY: 2x/week  PT DURATION: 6 weeks  PLANNED INTERVENTIONS: 97164- PT Re-evaluation, 97750- Physical Performance Testing, 97110-Therapeutic exercises, 97530- Therapeutic activity, [id number]- Neuromuscular re-education, 97535- Self Care, [id number]- Manual therapy, [id number]- Electrical stimulation (unattended), 97016- Vasopneumatic device, Patient/Family education, Taping, Dry Needling, Joint mobilization, Cryotherapy, and Moist heat  PLAN FOR NEXT SESSION: pulleys, isometrics, manual therapy, PROM, and modalities as needed    Lane Pinon, PT 06/10/2023, 3:34 PM

## 2023-06-17 ENCOUNTER — Ambulatory Visit: Admitting: Physical Therapy

## 2023-06-17 DIAGNOSIS — M6281 Muscle weakness (generalized): Secondary | ICD-10-CM

## 2023-06-17 DIAGNOSIS — M79622 Pain in left upper arm: Secondary | ICD-10-CM | POA: Diagnosis not present

## 2023-06-17 NOTE — Therapy (Signed)
 OUTPATIENT PHYSICAL THERAPY SHOULDER TREATMENT   Patient Name: Caitlin Bond MRN: [id number] DOB:04-30-1960, 63 y.o., female Today's Date: 06/17/2023  END OF SESSION:  PT End of Session - 06/17/23 1612     Visit Number 5    Number of Visits 12    Date for PT Re-Evaluation 07/30/23    PT Start Time 0402    PT Stop Time 0501    PT Time Calculation (min) 59 min    Activity Tolerance Patient tolerated treatment well    Behavior During Therapy [facility] for tasks assessed/performed               Past Medical History:  Diagnosis Date   Anxiety    Arthritis    GERD (gastroesophageal reflux disease)    Hypertension    Past Surgical History:  Procedure Laterality Date   COLONOSCOPY N/A 09/12/2013   Procedure: COLONOSCOPY;  Surgeon: Suzette Espy, MD;  Location: [facility];  Service: Endoscopy;  Laterality: N/A;  9:45   KNEE ARTHROSCOPY WITH MEDIAL MENISECTOMY Right 11/09/2014   Procedure: KNEE ARTHROSCOPY WITH MEDIAL MENISECTOMY AND LIMITED DEBRIDEMENT;  Surgeon: Darrin Emerald, MD;  Location: [facility];  Service: Orthopedics;  Laterality: Right;   URETHRAL DILATION     as child   Patient Active Problem List   Diagnosis Date Noted   Medial meniscus, posterior horn derangement    Acute meniscal tear of right knee 11/21/2013   Primary osteoarthritis of right knee 11/21/2013   Derangement of posterior horn of medial meniscus 11/21/2013   Heme positive stool 08/14/2013   REFERRING PROVIDER: Lenn Quint, NP   REFERRING DIAG: Pain in right arm   THERAPY DIAG:  Pain in left upper arm  Muscle weakness (generalized)  Rationale for Evaluation and Treatment: Rehabilitation  ONSET DATE: approximately 4 months ago  SUBJECTIVE:                                                                                                                                                                                      SUBJECTIVE STATEMENT: Still hurting.  PERTINENT  HISTORY: OA, current smoker, anxiety, and hypertension  PAIN:  Are you having pain? Yes: NPRS scale: Current: 9/10 Worst: 9/10 Pain location: left biceps Pain description: intermittent sharp, grabbing pain (1-2 minutes)  Aggravating factors: reaching overhead and behind her back Relieving factors: heat, rest  PRECAUTIONS: None  RED FLAGS: None   WEIGHT BEARING RESTRICTIONS: No  FALLS:  Has patient fallen in last 6 months? No  LIVING ENVIRONMENT: Lives with: lives alone Lives in: House/apartment Has following equipment at home: None  OCCUPATION: Retired  PLOF: Independent  PATIENT GOALS:  reduced pain, be able to reach overhead, and wash her back  NEXT MD VISIT: end of April 2025  OBJECTIVE:  Note: Objective measures were completed at Evaluation unless otherwise noted.  PATIENT SURVEYS:  Quick Dash 54.55  COGNITION: Overall cognitive status: Within functional limits for tasks assessed     SENSATION: Patient reports no numbness or tingling  POSTURE: Forward head  UPPER EXTREMITY ROM:   Active ROM Right eval Left eval  Shoulder flexion 136 106; limited by pain   Shoulder extension    Shoulder abduction 146 106; limited by pain  Shoulder adduction    Shoulder internal rotation To L2 To left gluteals  Shoulder external rotation To T2 To C7  Elbow flexion    Elbow extension    Wrist flexion    Wrist extension    Wrist ulnar deviation    Wrist radial deviation    Wrist pronation    Wrist supination    (Blank rows = not tested)  UPPER EXTREMITY MMT:  MMT Right eval Left eval  Shoulder flexion 4/5 4/5  Shoulder extension    Shoulder abduction 3+/5 3+/5; limited by pain   Shoulder adduction    Shoulder internal rotation 4+/5 4/5  Shoulder external rotation 4/5 4-/5; limited by pain  Middle trapezius    Lower trapezius    Elbow flexion 4+/5 4-/5  Elbow extension 4+/5 4/5  Wrist flexion    Wrist extension    Wrist ulnar deviation    Wrist  radial deviation    Wrist pronation    Wrist supination    Grip strength (lbs) 40 40  (Blank rows = not tested)  JOINT MOBILITY TESTING:   Left glenohumeral: hypomobile and familiar pain   PALPATION:  TTP: left infraspinatus and biceps                                                                                                                             TREATMENT DATE:   06/17/23:                                       EXERCISE LOG  Exercise Repetitions and Resistance Comments  UBE 8 minutes   Pulleys  5 minutes   Seated UE Ranger 5 minutes   Combo e'stim/uS  at 1.50 W/CM2 x 8 minutes to patient's left biceps groove region f/b STW/M x 8 minutes including to posterior cuff f/b HMP and IFC at 80-150 Hz on 40% scan x 20 minutes.   HMP and IFC at 80-150 Hz on 40% scan x                                      06/10/23 EXERCISE LOG  Exercise Repetitions and Resistance Comments  Pulleys  6 minutes  Flexion  UE ranger (seated) 4.5 minutes Multidirectional   UBE  8 minutes @ 120 RPM    Resisted row  Green t-band x 3 minutes   Therabar bending  Red t-bar x 2.5 minutes each Up and down   Horizontal ABD  Red t-band x 2 minutes   Bicep curl  4# x 30 reps each With forearms supinated and neutral   Therabar twisting  Red t-bar x 2 minutes    Blank cell = exercise not performed today    PATIENT EDUCATION: Education details: HEP and healing Person educated: Patient Education method: Explanation Education comprehension: verbalized understanding  HOME EXERCISE PROGRAM:   ASSESSMENT:  CLINICAL IMPRESSION:  The patient was palpably tender over her left bicipital groove and into the muscle belly as well as over her middle deltoid region and infraspinatus.  She tolerated treatment well without compliant.    OBJECTIVE IMPAIRMENTS: decreased activity tolerance, decreased ROM, decreased strength, hypomobility, impaired tone, impaired UE functional use, and pain.   ACTIVITY LIMITATIONS:  carrying, lifting, bathing, dressing, and reach over head  PARTICIPATION LIMITATIONS: meal prep, cleaning, laundry, shopping, and community activity  PERSONAL FACTORS: Past/current experiences, Time since onset of injury/illness/exacerbation, and 3+ comorbidities: OA, current smoker, anxiety, and hypertension are also affecting patient's functional outcome.   REHAB POTENTIAL: Good  CLINICAL DECISION MAKING: Evolving/moderate complexity  EVALUATION COMPLEXITY: Moderate   GOALS: Goals reviewed with patient? Yes  SHORT TERM GOALS: Target date: 06/07/23  Patient will be independent with initial HEP. Baseline: Goal status: INITIAL  2.  Patient will be able to complete her daily activities without her familiar left shoulder pain exceeding 7/10. Baseline:  Goal status: INITIAL  3.  Patient will be able to demonstrate at least 118 degrees of left shoulder flexion for improved functional reaching overhead. Baseline:  Goal status: INITIAL  4.  Patient will be able to demonstrate at least 118 degrees of active left shoulder abduction for improved functional reaching overhead Baseline:  Goal status: INITIAL  5.  Patient will improve her QuickDASH score to 44 or less for improved perceived function with her daily activities. Baseline:  Goal status: INITIAL  LONG TERM GOALS: Target date: 06/28/23  Patient will be independent with her advanced HEP. Baseline:  Goal status: INITIAL  2.  Patient will be able to lift at least 5 pounds with her left upper extremity overhead for improved function putting away her dishes. Baseline:  Goal status: INITIAL  3.  Patient will be able to demonstrate at least 130 degrees of active left shoulder flexion for improved function with overhead activities. Baseline:  Goal status: INITIAL  4.  Patient will report being able to wash her back with her left upper extremity for improved functional mobility. Baseline:  Goal status: INITIAL  5.  Patient  will improve her QuickDASH score to 34 or less for improved perceived function with her daily activities. Baseline:  Goal status: INITIAL  PLAN:  PT FREQUENCY: 2x/week  PT DURATION: 6 weeks  PLANNED INTERVENTIONS: 97164- PT Re-evaluation, 97750- Physical Performance Testing, 97110-Therapeutic exercises, 97530- Therapeutic activity, [id number]- Neuromuscular re-education, 97535- Self Care, 60454- Manual therapy, G0283- Electrical stimulation (unattended), 97016- Vasopneumatic device, Patient/Family education, Taping, Dry Needling, Joint mobilization, Cryotherapy, and Moist heat  PLAN FOR NEXT SESSION: pulleys, isometrics, manual therapy, PROM, and modalities as needed    Shanterica Biehler, [place], PT 06/17/2023, 5:20 PM

## 2023-06-21 ENCOUNTER — Ambulatory Visit: Admitting: Orthopedic Surgery

## 2023-06-21 ENCOUNTER — Other Ambulatory Visit (INDEPENDENT_AMBULATORY_CARE_PROVIDER_SITE_OTHER): Payer: Self-pay

## 2023-06-21 VITALS — BP 112/75 | HR 82 | Ht 67.0 in | Wt 270.0 lb

## 2023-06-21 DIAGNOSIS — M7542 Impingement syndrome of left shoulder: Secondary | ICD-10-CM

## 2023-06-21 DIAGNOSIS — G8929 Other chronic pain: Secondary | ICD-10-CM

## 2023-06-21 DIAGNOSIS — M25512 Pain in left shoulder: Secondary | ICD-10-CM | POA: Diagnosis not present

## 2023-06-21 MED ORDER — METHYLPREDNISOLONE ACETATE 40 MG/ML IJ SUSP
40.0000 mg | Freq: Once | INTRAMUSCULAR | Status: AC
  Administered 2023-06-21: 40 mg via INTRA_ARTICULAR

## 2023-06-21 MED ORDER — TRAMADOL HCL 50 MG PO TABS: 50.0000 mg | ORAL_TABLET | Freq: Four times a day (QID) | ORAL | 5 refills | Status: DC | PRN

## 2023-06-21 NOTE — Progress Notes (Signed)
  Intake history:  BP 112/75   Pulse 82   Ht 5\' 7"  (1.702 m)   Wt 270 lb (122.5 kg)   BMI 42.29 kg/m  Body mass index is 42.29 kg/m.    WHAT ARE WE SEEING YOU FOR TODAY?   left arm(s) radiate down to fingers  How Gohan Collister has this bothered you? (DOI?DOS?WS?)  5 month(s) ago  Anticoag.  No  Diabetes No  Heart disease No  Hypertension Yes  SMOKING HX Yes  Kidney disease No  Any ALLERGIES ______________________________________________   Treatment:  Have you taken:  Tylenol  Yes  Advil Yes  Had PT Yes  Had injection No  Other  _________________________

## 2023-06-21 NOTE — Progress Notes (Signed)
 Chief Complaint  Patient presents with   Arm Pain    Left- pain for five months    63 year old female 67-month history of acute onset of left shoulder pain  She went to her primary care doctor he sent her for physical therapy she has not improved She complains of pain over the front of the shoulder and biceps and also up into the trapezius muscle and supraspinatus fossa She has taken some ibuprofen no relief  In therapy she notes decreased ability to internally rotate the shoulder behind her back  Examination of the left shoulder she is tender over the posterolateral corner anterolateral corner deltoid and biceps and in the supraspinatus fossa  She can only in currently rotate to her hip pocket  She has limited flexion and abduction   DG Shoulder Left Result Date: 06/21/2023 Acute pain left shoulder 5 months ago X-rays show small inferior glenohumeral osteophyte normal symmetric glenohumeral joint space around humeral head sclerosis greater tuberosity joint space narrowing and osteophyte formation at the acromioclavicular joint Impression mild minimal 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 After pinning the skin with alcohol and anesthetized the skin with ethyl chloride the subacromial space was injected using a 20-gauge needle. There were no complications  Sterile dressing was applied.

## 2023-06-23 ENCOUNTER — Ambulatory Visit

## 2023-06-23 DIAGNOSIS — M6281 Muscle weakness (generalized): Secondary | ICD-10-CM

## 2023-06-23 DIAGNOSIS — M79622 Pain in left upper arm: Secondary | ICD-10-CM

## 2023-06-23 NOTE — Therapy (Signed)
 OUTPATIENT PHYSICAL THERAPY SHOULDER TREATMENT   Patient Name: Caitlin Bond MRN: [id number] DOB:[birth year], 63 y.o., female Today's Date: 06/23/2023  END OF SESSION:  PT End of Session - 06/23/23 1430     Visit Number 6    Number of Visits 12    Date for PT Re-Evaluation 07/30/23    PT Start Time 1429    PT Stop Time 1520    PT Time Calculation (min) 51 min    Activity Tolerance Patient tolerated treatment well    Behavior During Therapy WFL for tasks assessed/performed                Past Medical History:  Diagnosis Date   Anxiety    Arthritis    GERD (gastroesophageal reflux disease)    Hypertension    Past Surgical History:  Procedure Laterality Date   COLONOSCOPY N/A 09/12/2013   Procedure: COLONOSCOPY;  Surgeon: Suzette Espy, MD;  Location: [facility];  Service: Endoscopy;  Laterality: N/A;  9:45   KNEE ARTHROSCOPY WITH MEDIAL MENISECTOMY Right 11/09/2014   Procedure: KNEE ARTHROSCOPY WITH MEDIAL MENISECTOMY AND LIMITED DEBRIDEMENT;  Surgeon: Darrin Emerald, MD;  Location: [facility];  Service: Orthopedics;  Laterality: Right;   URETHRAL DILATION     as child   Patient Active Problem List   Diagnosis Date Noted   Medial meniscus, posterior horn derangement    Acute meniscal tear of right knee 11/21/2013   Primary osteoarthritis of right knee 11/21/2013   Derangement of posterior horn of medial meniscus 11/21/2013   Heme positive stool 08/14/2013   REFERRING PROVIDER: Lenn Quint, NP   REFERRING DIAG: Pain in right arm   THERAPY DIAG:  Pain in left upper arm  Muscle weakness (generalized)  Rationale for Evaluation and Treatment: Rehabilitation  ONSET DATE: approximately 4 months ago  SUBJECTIVE:                                                                                                                                                                                      SUBJECTIVE STATEMENT: Patient reports that she felt pretty  good after her last appointment. She is not hurting right now as she got a shot on Monday.   PERTINENT HISTORY: OA, current smoker, anxiety, and hypertension  PAIN:  Are you having pain? Yes: NPRS scale: Current: 0/10 Worst: 9/10 Pain location: left biceps Pain description: intermittent sharp, grabbing pain (1-2 minutes)  Aggravating factors: reaching overhead and behind her back Relieving factors: heat, rest  PRECAUTIONS: None  RED FLAGS: None   WEIGHT BEARING RESTRICTIONS: No  FALLS:  Has patient fallen in last 6 months? No  LIVING ENVIRONMENT: Lives with: lives alone Lives in: House/apartment Has following equipment at home: None  OCCUPATION: Retired  PLOF: Independent  PATIENT GOALS: reduced pain, be able to reach overhead, and wash her back  NEXT MD VISIT: 08/02/23  OBJECTIVE:  Note: Objective measures were completed at Evaluation unless otherwise noted.  PATIENT SURVEYS:  Quick Dash 54.55  COGNITION: Overall cognitive status: Within functional limits for tasks assessed     SENSATION: Patient reports no numbness or tingling  POSTURE: Forward head  UPPER EXTREMITY ROM:   Active ROM Right eval Left eval  Shoulder flexion 136 106; limited by pain   Shoulder extension    Shoulder abduction 146 106; limited by pain  Shoulder adduction    Shoulder internal rotation To L2 To left gluteals  Shoulder external rotation To T2 To C7  Elbow flexion    Elbow extension    Wrist flexion    Wrist extension    Wrist ulnar deviation    Wrist radial deviation    Wrist pronation    Wrist supination    (Blank rows = not tested)  UPPER EXTREMITY MMT:  MMT Right eval Left eval  Shoulder flexion 4/5 4/5  Shoulder extension    Shoulder abduction 3+/5 3+/5; limited by pain   Shoulder adduction    Shoulder internal rotation 4+/5 4/5  Shoulder external rotation 4/5 4-/5; limited by pain  Middle trapezius    Lower trapezius    Elbow flexion 4+/5 4-/5  Elbow  extension 4+/5 4/5  Wrist flexion    Wrist extension    Wrist ulnar deviation    Wrist radial deviation    Wrist pronation    Wrist supination    Grip strength (lbs) 40 40  (Blank rows = not tested)  JOINT MOBILITY TESTING:   Left glenohumeral: hypomobile and familiar pain   PALPATION:  TTP: left infraspinatus and biceps                                                                                                                             TREATMENT DATE:                                    06/23/23 EXERCISE LOG  Exercise Repetitions and Resistance Comments  Pulleys  5.5 minutes   UE ranger (standing)  3.5 minutes  Circles  UBE  10 minutes @ 90 RPM   Isometric ball squeeze 4 minutes w/ 5 second hold   Therabar bending Red t-bar @ 4 minutes    Blank cell = exercise not performed today  Modalities: no adverse reaction to today's modalities  Date:  Unattended Estim: left biceps , pre mod @ 80-150 Hz, 15 mins, Tone Hot Pack: Shoulder, 15 mins, Tone  06/17/23:  EXERCISE LOG  Exercise Repetitions and Resistance Comments  UBE 8 minutes   Pulleys  5 minutes  Seated UE Ranger 5 minutes   Combo e'stim/uS  at 1.50 W/CM2 x 8 minutes to patient's left biceps groove region f/b STW/M x 8 minutes including to posterior cuff f/b HMP and IFC at 80-150 Hz on 40% scan x 20 minutes.   HMP and IFC at 80-150 Hz on 40% scan x                                      06/10/23 EXERCISE LOG  Exercise Repetitions and Resistance Comments  Pulleys  6 minutes  Flexion   UE ranger (seated) 4.5 minutes Multidirectional   UBE  8 minutes @ 120 RPM    Resisted row  Green t-band x 3 minutes   Therabar bending  Red t-bar x 2.5 minutes each Up and down   Horizontal ABD  Red t-band x 2 minutes   Bicep curl  4# x 30 reps each With forearms supinated and neutral   Therabar twisting  Red t-bar x 2 minutes    Blank cell = exercise not performed today    PATIENT EDUCATION: Education details: HEP and  healing Person educated: Patient Education method: Explanation Education comprehension: verbalized understanding  HOME EXERCISE PROGRAM:   ASSESSMENT:  CLINICAL IMPRESSION:   Patient was progressed with multiple interventions for improved left shoulder mobility. She required minimal cueing with today's interventions for proper pacing to promote left shoulder mobility without aggravating her familiar symptoms. She experienced no significant increase in pain or discomfort with any of today's interventions. She reported that her shoulder felt good upon the conclusion of treatment. She continues to require skilled physical therapy to address her remaining impairments to return to her prior level of function.   OBJECTIVE IMPAIRMENTS: decreased activity tolerance, decreased ROM, decreased strength, hypomobility, impaired tone, impaired UE functional use, and pain.   ACTIVITY LIMITATIONS: carrying, lifting, bathing, dressing, and reach over head  PARTICIPATION LIMITATIONS: meal prep, cleaning, laundry, shopping, and community activity  PERSONAL FACTORS: Past/current experiences, Time since onset of injury/illness/exacerbation, and 3+ comorbidities: OA, current smoker, anxiety, and hypertension are also affecting patient's functional outcome.   REHAB POTENTIAL: Good  CLINICAL DECISION MAKING: Evolving/moderate complexity  EVALUATION COMPLEXITY: Moderate   GOALS: Goals reviewed with patient? Yes  SHORT TERM GOALS: Target date: 06/07/23  Patient will be independent with initial HEP. Baseline: Goal status: INITIAL  2.  Patient will be able to complete her daily activities without her familiar left shoulder pain exceeding 7/10. Baseline:  Goal status: INITIAL  3.  Patient will be able to demonstrate at least 118 degrees of left shoulder flexion for improved functional reaching overhead. Baseline:  Goal status: INITIAL  4.  Patient will be able to demonstrate at least 118 degrees of  active left shoulder abduction for improved functional reaching overhead Baseline:  Goal status: INITIAL  5.  Patient will improve her QuickDASH score to 44 or less for improved perceived function with her daily activities. Baseline:  Goal status: INITIAL  LONG TERM GOALS: Target date: 06/28/23  Patient will be independent with her advanced HEP. Baseline:  Goal status: INITIAL  2.  Patient will be able to lift at least 5 pounds with her left upper extremity overhead for improved function putting away her dishes. Baseline:  Goal status: INITIAL  3.  Patient will be able to demonstrate at least 130 degrees of active left shoulder flexion for improved function with overhead activities. Baseline:  Goal status: INITIAL  4.  Patient will report being able to wash her back with her left upper extremity for improved functional mobility. Baseline:  Goal status: INITIAL  5.  Patient will improve her QuickDASH score to 34 or less for improved perceived function with her daily activities. Baseline:  Goal status: INITIAL  PLAN:  PT FREQUENCY: 2x/week  PT DURATION: 6 weeks  PLANNED INTERVENTIONS: 97164- PT Re-evaluation, 97750- Physical Performance Testing, 97110-Therapeutic exercises, 97530- Therapeutic activity, [id number]- Neuromuscular re-education, 97535- Self Care, [id number]- Manual therapy, [id number]- Electrical stimulation (unattended), 97016- Vasopneumatic device, Patient/Family education, Taping, Dry Needling, Joint mobilization, Cryotherapy, and Moist heat  PLAN FOR NEXT SESSION: pulleys, isometrics, manual therapy, PROM, and modalities as needed    Lane Pinon, PT 06/23/2023, 3:30 PM

## 2023-06-30 ENCOUNTER — Encounter

## 2023-07-01 ENCOUNTER — Ambulatory Visit (HOSPITAL_COMMUNITY)

## 2023-07-07 ENCOUNTER — Ambulatory Visit: Attending: Internal Medicine

## 2023-07-07 DIAGNOSIS — M79622 Pain in left upper arm: Secondary | ICD-10-CM | POA: Insufficient documentation

## 2023-07-07 DIAGNOSIS — M6281 Muscle weakness (generalized): Secondary | ICD-10-CM | POA: Diagnosis present

## 2023-07-07 NOTE — Therapy (Signed)
 OUTPATIENT PHYSICAL THERAPY SHOULDER TREATMENT   Patient Name: Caitlin Bond MRN: [id number] DOB:07/06/1960, 63 y.o., female Today's Date: 07/07/2023  END OF SESSION:  PT End of Session - 07/07/23 1427     Visit Number 7    Number of Visits 12    Date for PT Re-Evaluation 07/30/23    PT Start Time 1427    PT Stop Time 1528    PT Time Calculation (min) 61 min    Activity Tolerance Patient tolerated treatment well    Behavior During Therapy WFL for tasks assessed/performed                 Past Medical History:  Diagnosis Date   Anxiety    Arthritis    GERD (gastroesophageal reflux disease)    Hypertension    Past Surgical History:  Procedure Laterality Date   COLONOSCOPY N/A 09/12/2013   Procedure: COLONOSCOPY;  Surgeon: Suzette Espy, MD;  Location: [facility];  Service: Endoscopy;  Laterality: N/A;  9:45   KNEE ARTHROSCOPY WITH MEDIAL MENISECTOMY Right 11/09/2014   Procedure: KNEE ARTHROSCOPY WITH MEDIAL MENISECTOMY AND LIMITED DEBRIDEMENT;  Surgeon: Darrin Emerald, MD;  Location: [facility];  Service: Orthopedics;  Laterality: Right;   URETHRAL DILATION     as child   Patient Active Problem List   Diagnosis Date Noted   Medial meniscus, posterior horn derangement    Acute meniscal tear of right knee 11/21/2013   Primary osteoarthritis of right knee 11/21/2013   Derangement of posterior horn of medial meniscus 11/21/2013   Heme positive stool 08/14/2013   REFERRING PROVIDER: Lenn Quint, NP   REFERRING DIAG: Pain in right arm   THERAPY DIAG:  Pain in left upper arm  Muscle weakness (generalized)  Rationale for Evaluation and Treatment: Rehabilitation  ONSET DATE: approximately 4 months ago  SUBJECTIVE:                                                                                                                                                                                      SUBJECTIVE STATEMENT: Patient reports that her arm still  hurts when she moves it. She has been using heat at home which helps for about 30-40 minutes afterward.   PERTINENT HISTORY: OA, current smoker, anxiety, and hypertension  PAIN:  Are you having pain? Yes: NPRS scale: Current: 9/10 Worst: 9/10 Pain location: left biceps Pain description: intermittent sharp, grabbing pain (1-2 minutes)  Aggravating factors: reaching overhead and behind her back Relieving factors: heat, rest  PRECAUTIONS: None  RED FLAGS: None   WEIGHT BEARING RESTRICTIONS: No  FALLS:  Has patient fallen in last 6 months?  No  LIVING ENVIRONMENT: Lives with: lives alone Lives in: House/apartment Has following equipment at home: None  OCCUPATION: Retired  PLOF: Independent  PATIENT GOALS: reduced pain, be able to reach overhead, and wash her back  NEXT MD VISIT: 08/02/23  OBJECTIVE:  Note: Objective measures were completed at Evaluation unless otherwise noted.  PATIENT SURVEYS:  Quick Dash 54.55  COGNITION: Overall cognitive status: Within functional limits for tasks assessed     SENSATION: Patient reports no numbness or tingling  POSTURE: Forward head  UPPER EXTREMITY ROM:   Active ROM Right eval Left eval  Shoulder flexion 136 106; limited by pain   Shoulder extension    Shoulder abduction 146 106; limited by pain  Shoulder adduction    Shoulder internal rotation To L2 To left gluteals  Shoulder external rotation To T2 To C7  Elbow flexion    Elbow extension    Wrist flexion    Wrist extension    Wrist ulnar deviation    Wrist radial deviation    Wrist pronation    Wrist supination    (Blank rows = not tested)  UPPER EXTREMITY MMT:  MMT Right eval Left eval  Shoulder flexion 4/5 4/5  Shoulder extension    Shoulder abduction 3+/5 3+/5; limited by pain   Shoulder adduction    Shoulder internal rotation 4+/5 4/5  Shoulder external rotation 4/5 4-/5; limited by pain  Middle trapezius    Lower trapezius    Elbow flexion 4+/5  4-/5  Elbow extension 4+/5 4/5  Wrist flexion    Wrist extension    Wrist ulnar deviation    Wrist radial deviation    Wrist pronation    Wrist supination    Grip strength (lbs) 40 40  (Blank rows = not tested)  JOINT MOBILITY TESTING:   Left glenohumeral: hypomobile and familiar pain   PALPATION:  TTP: left infraspinatus and biceps                                                                                                                             TREATMENT DATE:                                    07/07/23 EXERCISE LOG  Exercise Repetitions and Resistance Comments  Pulleys  5.5 minutes  Flexion  UBE  10 minutes @ 90 RPM   Wall ladder   10 reps  Max #22  Isometric ball squeeze  3 minutes w/ 5 second hold  For shoulder IR  UE ranger (seated)  4 minutes  Multidirectional   Therabar bending  Red t-bar x 4 minutes  Up and down  Resisted row  Green t-band x 3 minutes   Resisted pull down  Green t-band x 3 minutes    Blank cell = exercise not performed today  Modalities: no redness or adverse reaction to today's  modalities  Date:  Unattended Estim: left biceps, pre mod @ 80-150 Hz, 15 mins, Pain and Tone Hot Pack: Shoulder, 15 mins, Pain and Tone                                   06/23/23 EXERCISE LOG  Exercise Repetitions and Resistance Comments  Pulleys  5.5 minutes   UE ranger (standing)  3.5 minutes  Circles  UBE  10 minutes @ 90 RPM   Isometric ball squeeze 4 minutes w/ 5 second hold   Therabar bending Red t-bar @ 4 minutes    Blank cell = exercise not performed today  Modalities: no adverse reaction to today's modalities  Date:  Unattended Estim: left biceps , pre mod @ 80-150 Hz, 15 mins, Tone Hot Pack: Shoulder, 15 mins, Tone  06/17/23:  EXERCISE LOG  Exercise Repetitions and Resistance Comments  UBE 8 minutes   Pulleys  5 minutes   Seated UE Ranger 5 minutes   Combo e'stim/uS  at 1.50 W/CM2 x 8 minutes to patient's left biceps groove region f/b STW/M x 8  minutes including to posterior cuff f/b HMP and IFC at 80-150 Hz on 40% scan x 20 minutes.   HMP and IFC at 80-150 Hz on 40% scan x   PATIENT EDUCATION: Education details: HEP and healing Person educated: Patient Education method: Explanation Education comprehension: verbalized understanding  HOME EXERCISE PROGRAM:   ASSESSMENT:  CLINICAL IMPRESSION:   Patient was progressed with the wall ladder for improved function reaching overhead. She required minimal cueing with therabar bending for improved eccentric control. She reported a reduction in her familiar symptoms with today's active interventions. She reported that her arm felt better upon the conclusion of treatment. She continues to require skilled physical therapy to address her remaining impairments to return to her prior level of function.   OBJECTIVE IMPAIRMENTS: decreased activity tolerance, decreased ROM, decreased strength, hypomobility, impaired tone, impaired UE functional use, and pain.   ACTIVITY LIMITATIONS: carrying, lifting, bathing, dressing, and reach over head  PARTICIPATION LIMITATIONS: meal prep, cleaning, laundry, shopping, and community activity  PERSONAL FACTORS: Past/current experiences, Time since onset of injury/illness/exacerbation, and 3+ comorbidities: OA, current smoker, anxiety, and hypertension are also affecting patient's functional outcome.   REHAB POTENTIAL: Good  CLINICAL DECISION MAKING: Evolving/moderate complexity  EVALUATION COMPLEXITY: Moderate   GOALS: Goals reviewed with patient? Yes  SHORT TERM GOALS: Target date: 06/07/23  Patient will be independent with initial HEP. Baseline:"I do some every day"  Goal status: MET  2.  Patient will be able to complete her daily activities without her familiar left shoulder pain exceeding 7/10. Baseline:  Goal status: ON GOING  3.  Patient will be able to demonstrate at least 118 degrees of left shoulder flexion for improved functional  reaching overhead. Baseline:  Goal status: INITIAL  4.  Patient will be able to demonstrate at least 118 degrees of active left shoulder abduction for improved functional reaching overhead Baseline:  Goal status: INITIAL  5.  Patient will improve her QuickDASH score to 44 or less for improved perceived function with her daily activities. Baseline:  Goal status: INITIAL  LONG TERM GOALS: Target date: 06/28/23  Patient will be independent with her advanced HEP. Baseline:  Goal status: ON GOING  2.  Patient will be able to lift at least 5 pounds with her left upper extremity overhead for improved function putting away her dishes.  Baseline:  Goal status: INITIAL  3.  Patient will be able to demonstrate at least 130 degrees of active left shoulder flexion for improved function with overhead activities. Baseline:  Goal status: INITIAL  4.  Patient will report being able to wash her back with her left upper extremity for improved functional mobility. Baseline:  Goal status: INITIAL  5.  Patient will improve her QuickDASH score to 34 or less for improved perceived function with her daily activities. Baseline:  Goal status: INITIAL  PLAN:  PT FREQUENCY: 2x/week  PT DURATION: 6 weeks  PLANNED INTERVENTIONS: 97164- PT Re-evaluation, 97750- Physical Performance Testing, 97110-Therapeutic exercises, 97530- Therapeutic activity, [id number]- Neuromuscular re-education, 97535- Self Care, 54098- Manual therapy, [id number]- Electrical stimulation (unattended), 97016- Vasopneumatic device, Patient/Family education, Taping, Dry Needling, Joint mobilization, Cryotherapy, and Moist heat  PLAN FOR NEXT SESSION: pulleys, isometrics, manual therapy, PROM, and modalities as needed    Lane Pinon, PT 07/07/2023, 3:31 PM

## 2023-07-14 ENCOUNTER — Ambulatory Visit

## 2023-07-14 DIAGNOSIS — M79622 Pain in left upper arm: Secondary | ICD-10-CM

## 2023-07-14 DIAGNOSIS — M6281 Muscle weakness (generalized): Secondary | ICD-10-CM

## 2023-07-14 NOTE — Therapy (Signed)
 OUTPATIENT PHYSICAL THERAPY SHOULDER TREATMENT   Patient Name: Caitlin Bond MRN: [id number] DOB:1960-09-09, 63 y.o., female Today's Date: 07/14/2023  END OF SESSION:  PT End of Session - 07/14/23 1432     Visit Number 8    Number of Visits 12    Date for PT Re-Evaluation 07/30/23    PT Start Time 1430    PT Stop Time 1524    PT Time Calculation (min) 54 min    Activity Tolerance Patient tolerated treatment well    Behavior During Therapy WFL for tasks assessed/performed                 Past Medical History:  Diagnosis Date   Anxiety    Arthritis    GERD (gastroesophageal reflux disease)    Hypertension    Past Surgical History:  Procedure Laterality Date   COLONOSCOPY N/A 09/12/2013   Procedure: COLONOSCOPY;  Surgeon: Suzette Espy, MD;  Location: [facility];  Service: Endoscopy;  Laterality: N/A;  9:45   KNEE ARTHROSCOPY WITH MEDIAL MENISECTOMY Right 11/09/2014   Procedure: KNEE ARTHROSCOPY WITH MEDIAL MENISECTOMY AND LIMITED DEBRIDEMENT;  Surgeon: Darrin Emerald, MD;  Location: [facility];  Service: Orthopedics;  Laterality: Right;   URETHRAL DILATION     as child   Patient Active Problem List   Diagnosis Date Noted   Medial meniscus, posterior horn derangement    Acute meniscal tear of right knee 11/21/2013   Primary osteoarthritis of right knee 11/21/2013   Derangement of posterior horn of medial meniscus 11/21/2013   Heme positive stool 08/14/2013   REFERRING PROVIDER: Lenn Quint, NP   REFERRING DIAG: Pain in right arm   THERAPY DIAG:  Pain in left upper arm  Muscle weakness (generalized)  Rationale for Evaluation and Treatment: Rehabilitation  ONSET DATE: approximately 4 months ago  SUBJECTIVE:                                                                                                                                                                                      SUBJECTIVE STATEMENT: Pt reports minimal left arm pain  while at rest, but states that pain can get up to 9/10 with movement.   PERTINENT HISTORY: OA, current smoker, anxiety, and hypertension  PAIN:  Are you having pain? Yes: NPRS scale: Current: 9/10 Worst: 9/10 Pain location: left biceps Pain description: intermittent sharp, grabbing pain (1-2 minutes)  Aggravating factors: reaching overhead and behind her back Relieving factors: heat, rest  PRECAUTIONS: None  RED FLAGS: None   WEIGHT BEARING RESTRICTIONS: No  FALLS:  Has patient fallen in last 6 months? No  LIVING ENVIRONMENT: Lives  with: lives alone Lives in: House/apartment Has following equipment at home: None  OCCUPATION: Retired  PLOF: Independent  PATIENT GOALS: reduced pain, be able to reach overhead, and wash her back  NEXT MD VISIT: 08/02/23  OBJECTIVE:  Note: Objective measures were completed at Evaluation unless otherwise noted.  PATIENT SURVEYS:  Quick Dash 54.55  COGNITION: Overall cognitive status: Within functional limits for tasks assessed     SENSATION: Patient reports no numbness or tingling  POSTURE: Forward head  UPPER EXTREMITY ROM:   Active ROM Right eval Left eval  Shoulder flexion 136 106; limited by pain   Shoulder extension    Shoulder abduction 146 106; limited by pain  Shoulder adduction    Shoulder internal rotation To L2 To left gluteals  Shoulder external rotation To T2 To C7  Elbow flexion    Elbow extension    Wrist flexion    Wrist extension    Wrist ulnar deviation    Wrist radial deviation    Wrist pronation    Wrist supination    (Blank rows = not tested)  UPPER EXTREMITY MMT:  MMT Right eval Left eval  Shoulder flexion 4/5 4/5  Shoulder extension    Shoulder abduction 3+/5 3+/5; limited by pain   Shoulder adduction    Shoulder internal rotation 4+/5 4/5  Shoulder external rotation 4/5 4-/5; limited by pain  Middle trapezius    Lower trapezius    Elbow flexion 4+/5 4-/5  Elbow extension 4+/5 4/5   Wrist flexion    Wrist extension    Wrist ulnar deviation    Wrist radial deviation    Wrist pronation    Wrist supination    Grip strength (lbs) 40 40  (Blank rows = not tested)  JOINT MOBILITY TESTING:   Left glenohumeral: hypomobile and familiar pain   PALPATION:  TTP: left infraspinatus and biceps                                                                                                                             TREATMENT DATE:   07/14/23    EXERCISE LOG  Exercise Repetitions and Resistance Comments  Pulleys  6 minutes  Flexion  UBE  10 minutes @ 90 RPM   Wall ladder   10 reps  Max #22  Isometric ball squeeze  3 minutes w/ 5 second hold  For shoulder IR  UE ranger (seated)  CW and CCW x 2.5 mins each Multidirectional   Therabar bending  Red t-bar x 4 minutes  Up and down  Resisted row     Resisted pull down      Blank cell = exercise not performed today  Modalities: no redness or adverse reaction to today's modalities  Date:  Unattended Estim: left biceps, pre mod @ 80-150 Hz, 15 mins, Pain and Tone  06/23/23 EXERCISE LOG  Exercise Repetitions and Resistance Comments  Pulleys  5.5 minutes   UE ranger (standing)  3.5 minutes  Circles  UBE  10 minutes @ 90 RPM   Isometric ball squeeze 4 minutes w/ 5 second hold   Therabar bending Red t-bar @ 4 minutes    Blank cell = exercise not performed today  Modalities: no adverse reaction to today's modalities  Date:  Unattended Estim: left biceps , pre mod @ 80-150 Hz, 15 mins, Tone Hot Pack: Shoulder, 15 mins, Tone  06/17/23:  EXERCISE LOG  Exercise Repetitions and Resistance Comments  UBE 8 minutes   Pulleys  5 minutes   Seated UE Ranger 5 minutes   Combo e'stim/uS  at 1.50 W/CM2 x 8 minutes to patient's left biceps groove region f/b STW/M x 8 minutes including to posterior cuff f/b HMP and IFC at 80-150 Hz on 40% scan x 20 minutes.   HMP and IFC at 80-150 Hz on 40% scan x    PATIENT EDUCATION: Education details: HEP and healing Person educated: Patient Education method: Explanation Education comprehension: verbalized understanding  HOME EXERCISE PROGRAM:   ASSESSMENT:  CLINICAL IMPRESSION:   Pt arrives for today's treatment session reporting very minimal left shoulder/bicep pain while at rest.  Pt reports that pain can get up to 9/10 with movement.  After performing UBE and pulleys pt able to demonstrate increased left shoulder flexion and IR behind her back.  STW/M performed to left bicep and deltoid to decrease pain and tone.  Multiple trigger points noted.  Normal responses to estim noted upon removal.  Pt reporting her shoulder feeling good at completion of today's treatment session.   OBJECTIVE IMPAIRMENTS: decreased activity tolerance, decreased ROM, decreased strength, hypomobility, impaired tone, impaired UE functional use, and pain.   ACTIVITY LIMITATIONS: carrying, lifting, bathing, dressing, and reach over head  PARTICIPATION LIMITATIONS: meal prep, cleaning, laundry, shopping, and community activity  PERSONAL FACTORS: Past/current experiences, Time since onset of injury/illness/exacerbation, and 3+ comorbidities: OA, current smoker, anxiety, and hypertension are also affecting patient's functional outcome.   REHAB POTENTIAL: Good  CLINICAL DECISION MAKING: Evolving/moderate complexity  EVALUATION COMPLEXITY: Moderate   GOALS: Goals reviewed with patient? Yes  SHORT TERM GOALS: Target date: 06/07/23  Patient will be independent with initial HEP. Baseline:I do some every day  Goal status: MET  2.  Patient will be able to complete her daily activities without her familiar left shoulder pain exceeding 7/10. Baseline:  Goal status: ON GOING  3.  Patient will be able to demonstrate at least 118 degrees of left shoulder flexion for improved functional reaching overhead. Baseline:  Goal status: INITIAL  4.  Patient will be able to  demonstrate at least 118 degrees of active left shoulder abduction for improved functional reaching overhead Baseline:  Goal status: INITIAL  5.  Patient will improve her QuickDASH score to 44 or less for improved perceived function with her daily activities. Baseline:  Goal status: INITIAL  LONG TERM GOALS: Target date: 06/28/23  Patient will be independent with her advanced HEP. Baseline:  Goal status: ON GOING  2.  Patient will be able to lift at least 5 pounds with her left upper extremity overhead for improved function putting away her dishes. Baseline:  Goal status: INITIAL  3.  Patient will be able to demonstrate at least 130 degrees of active left shoulder flexion for improved function with overhead activities. Baseline:  Goal status: INITIAL  4.  Patient will report being able  to wash her back with her left upper extremity for improved functional mobility. Baseline:  Goal status: INITIAL  5.  Patient will improve her QuickDASH score to 34 or less for improved perceived function with her daily activities. Baseline:  Goal status: INITIAL  PLAN:  PT FREQUENCY: 2x/week  PT DURATION: 6 weeks  PLANNED INTERVENTIONS: 97164- PT Re-evaluation, 97750- Physical Performance Testing, 97110-Therapeutic exercises, 97530- Therapeutic activity, [id number]- Neuromuscular re-education, 97535- Self Care, 16109- Manual therapy, G0283- Electrical stimulation (unattended), 97016- Vasopneumatic device, Patient/Family education, Taping, Dry Needling, Joint mobilization, Cryotherapy, and Moist heat  PLAN FOR NEXT SESSION: pulleys, isometrics, manual therapy, PROM, and modalities as needed    Deryl Flora, PTA 07/14/2023, 4:31 PM

## 2023-07-21 ENCOUNTER — Ambulatory Visit

## 2023-07-21 DIAGNOSIS — M6281 Muscle weakness (generalized): Secondary | ICD-10-CM

## 2023-07-21 DIAGNOSIS — M79622 Pain in left upper arm: Secondary | ICD-10-CM

## 2023-07-21 NOTE — Therapy (Addendum)
 OUTPATIENT PHYSICAL THERAPY SHOULDER TREATMENT   Patient Name: Caitlin Bond MRN: [id number] DOB:January 08, 1961, 63 y.o., female Today's Date: 07/21/2023  END OF SESSION:  PT End of Session - 07/21/23 1434     Visit Number 9    Number of Visits 12    Date for PT Re-Evaluation 07/30/23    PT Start Time 1430    PT Stop Time 1535    PT Time Calculation (min) 65 min    Activity Tolerance Patient tolerated treatment well    Behavior During Therapy WFL for tasks assessed/performed              Past Medical History:  Diagnosis Date   Anxiety    Arthritis    GERD (gastroesophageal reflux disease)    Hypertension    Past Surgical History:  Procedure Laterality Date   COLONOSCOPY N/A 09/12/2013   Procedure: COLONOSCOPY;  Surgeon: Lamar CHRISTELLA Hollingshead, MD;  Location: [facility];  Service: Endoscopy;  Laterality: N/A;  9:45   KNEE ARTHROSCOPY WITH MEDIAL MENISECTOMY Right 11/09/2014   Procedure: KNEE ARTHROSCOPY WITH MEDIAL MENISECTOMY AND LIMITED DEBRIDEMENT;  Surgeon: Taft FORBES Minerva, MD;  Location: [facility];  Service: Orthopedics;  Laterality: Right;   URETHRAL DILATION     as child   Patient Active Problem List   Diagnosis Date Noted   Medial meniscus, posterior horn derangement    Acute meniscal tear of right knee 11/21/2013   Primary osteoarthritis of right knee 11/21/2013   Derangement of posterior horn of medial meniscus 11/21/2013   Heme positive stool 08/14/2013   REFERRING PROVIDER: Renato Dorothey CHRISTELLA, NP   REFERRING DIAG: Pain in right arm   THERAPY DIAG:  Pain in left upper arm  Muscle weakness (generalized)  Rationale for Evaluation and Treatment: Rehabilitation  ONSET DATE: approximately 4 months ago  SUBJECTIVE:                                                                                                                                                                                      SUBJECTIVE STATEMENT: Pt reports minimal left arm pain while at  rest, but states that pain can get up to 9/10 with movement.   PERTINENT HISTORY: OA, current smoker, anxiety, and hypertension  PAIN:  Are you having pain? Yes: NPRS scale: Current: minimal Pain location: left biceps Pain description: intermittent sharp, grabbing pain (1-2 minutes)  Aggravating factors: reaching overhead and behind her back Relieving factors: heat, rest  PRECAUTIONS: None  RED FLAGS: None   WEIGHT BEARING RESTRICTIONS: No  FALLS:  Has patient fallen in last 6 months? No  LIVING ENVIRONMENT: Lives with: lives alone Lives in:  House/apartment Has following equipment at home: None  OCCUPATION: Retired  PLOF: Independent  PATIENT GOALS: reduced pain, be able to reach overhead, and wash her back  NEXT MD VISIT: 08/02/23  OBJECTIVE:  Note: Objective measures were completed at Evaluation unless otherwise noted.  PATIENT SURVEYS:  Quick Dash 54.55  COGNITION: Overall cognitive status: Within functional limits for tasks assessed     SENSATION: Patient reports no numbness or tingling  POSTURE: Forward head  UPPER EXTREMITY ROM:   Active ROM Right eval Left eval  Shoulder flexion 136 106; limited by pain   Shoulder extension    Shoulder abduction 146 106; limited by pain  Shoulder adduction    Shoulder internal rotation To L2 To left gluteals  Shoulder external rotation To T2 To C7  Elbow flexion    Elbow extension    Wrist flexion    Wrist extension    Wrist ulnar deviation    Wrist radial deviation    Wrist pronation    Wrist supination    (Blank rows = not tested)  UPPER EXTREMITY MMT:  MMT Right eval Left eval  Shoulder flexion 4/5 4/5  Shoulder extension    Shoulder abduction 3+/5 3+/5; limited by pain   Shoulder adduction    Shoulder internal rotation 4+/5 4/5  Shoulder external rotation 4/5 4-/5; limited by pain  Middle trapezius    Lower trapezius    Elbow flexion 4+/5 4-/5  Elbow extension 4+/5 4/5  Wrist flexion     Wrist extension    Wrist ulnar deviation    Wrist radial deviation    Wrist pronation    Wrist supination    Grip strength (lbs) 40 40  (Blank rows = not tested)  JOINT MOBILITY TESTING:   Left glenohumeral: hypomobile and familiar pain   PALPATION:  TTP: left infraspinatus and biceps                                                                                                                             TREATMENT DATE:   07/21/23    EXERCISE LOG  Exercise Repetitions and Resistance Comments  Pulleys  6 minutes  Flexion  UBE  10 minutes @ 90 RPM   Wall ladder    Max #22  Goal Assessment See below   Ball on Wall 3 mins For shoulder IR  UE ranger (seated)  6 mins multidirecitonal  Multidirectional   Therabar bending   Up and down  Resisted row  Red x 25 reps   Resisted pull down  Red x 25 reps    Blank cell = exercise not performed today  Modalities: no redness or adverse reaction to today's modalities  Date:  Unattended Estim: left biceps, pre mod @ 80-150 Hz, 15 mins, Pain and Tone  06/23/23 EXERCISE LOG  Exercise Repetitions and Resistance Comments  Pulleys  5.5 minutes   UE ranger (standing)  3.5 minutes  Circles  UBE  10 minutes @ 90 RPM   Isometric ball squeeze 4 minutes w/ 5 second hold   Therabar bending Red t-bar @ 4 minutes    Blank cell = exercise not performed today  Modalities: no adverse reaction to today's modalities  Date:  Unattended Estim: left biceps , pre mod @ 80-150 Hz, 15 mins, Tone Hot Pack: Shoulder, 15 mins, Tone  06/17/23:  EXERCISE LOG  Exercise Repetitions and Resistance Comments  UBE 8 minutes   Pulleys  5 minutes   Seated UE Ranger 5 minutes   Combo e'stim/uS  at 1.50 W/CM2 x 8 minutes to patient's left biceps groove region f/b STW/M x 8 minutes including to posterior cuff f/b HMP and IFC at 80-150 Hz on 40% scan x 20 minutes.   HMP and IFC at 80-150 Hz on 40% scan x   PATIENT  EDUCATION: Education details: HEP and healing Person educated: Patient Education method: Explanation Education comprehension: verbalized understanding  HOME EXERCISE PROGRAM:   ASSESSMENT:  CLINICAL IMPRESSION:   Pt arrives for today's treatment session denying any pain, unless she performs certain movements.  Pt able to demonstrate 118 degrees of left shoulder flexion and 120 degrees of left shoulder abduction, meeting her STGs.  Pt reports that her pain continues to get up to 8/10 at times with certain wrong movements.  STW/M performed to left deltoid and bicep to decrease pain and tone.  Normal responses to estim noted upon removal.  Pt reports her arm feeling good at completion of today's treatment session.   07/21/23 PROGRESS REPORT:  Patient is making fair progress with skilled physical therapy as evidenced by her objective measures, functional mobility, and progress toward her goals. She was able to short term goals for improved left shoulder active range of motion. However, she continues to experience intermittent periods of high pain severity which limit her functional mobility. She was also unable to achieve any improvement with her QuickDASH score. Recommend that she continue with her current plan of care to address her remaining impairments to maximize her functional mobility.   Lacinda Fass, PT, DPT   OBJECTIVE IMPAIRMENTS: decreased activity tolerance, decreased ROM, decreased strength, hypomobility, impaired tone, impaired UE functional use, and pain.   ACTIVITY LIMITATIONS: carrying, lifting, bathing, dressing, and reach over head  PARTICIPATION LIMITATIONS: meal prep, cleaning, laundry, shopping, and community activity  PERSONAL FACTORS: Past/current experiences, Time since onset of injury/illness/exacerbation, and 3+ comorbidities: OA, current smoker, anxiety, and hypertension are also affecting patient's functional outcome.   REHAB POTENTIAL: Good  CLINICAL  DECISION MAKING: Evolving/moderate complexity  EVALUATION COMPLEXITY: Moderate   GOALS: Goals reviewed with patient? Yes  SHORT TERM GOALS: Target date: 06/07/23  Patient will be independent with initial HEP. Baseline:I do some every day  Goal status: MET  2.  Patient will be able to complete her daily activities without her familiar left shoulder pain exceeding 7/10. Baseline:  Goal status: IN PROGRESS  3.  Patient will be able to demonstrate at least 118 degrees of left shoulder flexion for improved functional reaching overhead. Baseline: 6/18: 118 degrees Goal status: MET  4.  Patient will be able to demonstrate at least 118 degrees of active left shoulder abduction for improved functional reaching overhead Baseline: 6/18: 120 degrees Goal status: MET  5.  Patient will improve her QuickDASH score to 44 or less  for improved perceived function with her daily activities. Baseline: 56% Goal status: IN PROGRESS  LONG TERM GOALS: Target date: 06/28/23  Patient will be independent with her advanced HEP. Baseline:  Goal status: ON GOING  2.  Patient will be able to lift at least 5 pounds with her left upper extremity overhead for improved function putting away her dishes. Baseline: 6/18: able to perform with discomfort Goal status: PARTIALLY MET  3.  Patient will be able to demonstrate at least 130 degrees of active left shoulder flexion for improved function with overhead activities. Baseline: see short term goals Goal status: IN PROGRESS  4.  Patient will report being able to wash her back with her left upper extremity for improved functional mobility. Baseline:  Goal status: IN PROGRESS  5.  Patient will improve her QuickDASH score to 34 or less for improved perceived function with her daily activities. Baseline:  Goal status: IN PROGRESS  PLAN:  PT FREQUENCY: 2x/week  PT DURATION: 6 weeks  PLANNED INTERVENTIONS: 97164- PT Re-evaluation, 97750- Physical  Performance Testing, 97110-Therapeutic exercises, 97530- Therapeutic activity, [id number]- Neuromuscular re-education, 97535- Self Care, [id number]- Manual therapy, G0283- Electrical stimulation (unattended), 97016- Vasopneumatic device, Patient/Family education, Taping, Dry Needling, Joint mobilization, Cryotherapy, and Moist heat  PLAN FOR NEXT SESSION: pulleys, isometrics, manual therapy, PROM, and modalities as needed    Delon DELENA Gosling, PTA 07/21/2023, 3:36 PM

## 2023-07-28 ENCOUNTER — Encounter

## 2023-08-02 ENCOUNTER — Encounter: Payer: Self-pay | Admitting: Orthopedic Surgery

## 2023-08-02 ENCOUNTER — Ambulatory Visit: Admitting: Orthopedic Surgery

## 2023-08-02 DIAGNOSIS — M25512 Pain in left shoulder: Secondary | ICD-10-CM | POA: Diagnosis not present

## 2023-08-02 DIAGNOSIS — G8929 Other chronic pain: Secondary | ICD-10-CM | POA: Diagnosis not present

## 2023-08-02 DIAGNOSIS — M75111 Incomplete rotator cuff tear or rupture of right shoulder, not specified as traumatic: Secondary | ICD-10-CM

## 2023-08-02 NOTE — Progress Notes (Signed)
   There were no vitals taken for this visit.  There is no height or weight on file to calculate BMI.  Chief Complaint  Patient presents with   Shoulder Pain    Left     No diagnosis found.  DOI/DOS/ Date:    Unchanged

## 2023-08-02 NOTE — Patient Instructions (Signed)
 While we are working on your approval for MRI please go ahead and call to schedule your appointment with [facility] within at least one (1) week.   Central Scheduling [phone]

## 2023-08-02 NOTE — Progress Notes (Signed)
 Chief Complaint  Patient presents with   Shoulder Pain    Left     Follow-up left shoulder after injection subacromial space  63 year old female 77-month history of acute onset left shoulder pain with stiffness and decreased forward elevation  She still exhibits the decreased internal rotation decreased abduction and flexion  She has mild weakness in abduction and flexion in the scapular plane  She has not improved after physical therapy and injection and tramadol   MRI left shoulder rule out rotator cuff tear  Encounter Diagnoses  Name Primary?   Chronic left shoulder pain    Nontraumatic incomplete tear of right rotator cuff Yes

## 2023-08-04 ENCOUNTER — Ambulatory Visit: Attending: Internal Medicine

## 2023-08-04 DIAGNOSIS — M6281 Muscle weakness (generalized): Secondary | ICD-10-CM | POA: Diagnosis present

## 2023-08-04 DIAGNOSIS — M79622 Pain in left upper arm: Secondary | ICD-10-CM | POA: Diagnosis present

## 2023-08-04 NOTE — Therapy (Signed)
 OUTPATIENT PHYSICAL THERAPY SHOULDER TREATMENT   Patient Name: Caitlin Bond MRN: [id number] DOB:24-Sep-1960, 63 y.o., female Today's Date: 08/04/2023  END OF SESSION:  PT End of Session - 08/04/23 1435     Visit Number 10    Number of Visits 12    Date for PT Re-Evaluation 07/30/23    PT Start Time 1430    PT Stop Time 1523    PT Time Calculation (min) 53 min    Activity Tolerance Patient tolerated treatment well    Behavior During Therapy WFL for tasks assessed/performed              Past Medical History:  Diagnosis Date   Anxiety    Arthritis    GERD (gastroesophageal reflux disease)    Hypertension    Past Surgical History:  Procedure Laterality Date   COLONOSCOPY N/A 09/12/2013   Procedure: COLONOSCOPY;  Surgeon: Lamar CHRISTELLA Hollingshead, MD;  Location: [facility];  Service: Endoscopy;  Laterality: N/A;  9:45   KNEE ARTHROSCOPY WITH MEDIAL MENISECTOMY Right 11/09/2014   Procedure: KNEE ARTHROSCOPY WITH MEDIAL MENISECTOMY AND LIMITED DEBRIDEMENT;  Surgeon: Taft FORBES Minerva, MD;  Location: [facility];  Service: Orthopedics;  Laterality: Right;   URETHRAL DILATION     as child   Patient Active Problem List   Diagnosis Date Noted   Chronic left shoulder pain 08/02/2023   Medial meniscus, posterior horn derangement    Acute meniscal tear of right knee 11/21/2013   Primary osteoarthritis of right knee 11/21/2013   Derangement of posterior horn of medial meniscus 11/21/2013   Heme positive stool 08/14/2013   REFERRING PROVIDER: Renato Dorothey CHRISTELLA, NP   REFERRING DIAG: Pain in right arm   THERAPY DIAG:  Pain in left upper arm  Muscle weakness (generalized)  Rationale for Evaluation and Treatment: Rehabilitation  ONSET DATE: approximately 4 months ago  SUBJECTIVE:                                                                                                                                                                                      SUBJECTIVE  STATEMENT: Pt reports minimal left arm pain while at rest, but states that pain can get up to 9/10 with movement.   PERTINENT HISTORY: OA, current smoker, anxiety, and hypertension  PAIN:  Are you having pain? Yes: NPRS scale: Current: minimal Pain location: left biceps Pain description: intermittent sharp, grabbing pain (1-2 minutes)  Aggravating factors: reaching overhead and behind her back Relieving factors: heat, rest  PRECAUTIONS: None  RED FLAGS: None   WEIGHT BEARING RESTRICTIONS: No  FALLS:  Has patient fallen in last 6 months? No  LIVING  ENVIRONMENT: Lives with: lives alone Lives in: House/apartment Has following equipment at home: None  OCCUPATION: Retired  PLOF: Independent  PATIENT GOALS: reduced pain, be able to reach overhead, and wash her back  NEXT MD VISIT: 08/02/23  OBJECTIVE:  Note: Objective measures were completed at Evaluation unless otherwise noted.  PATIENT SURVEYS:  Quick Dash 54.55  COGNITION: Overall cognitive status: Within functional limits for tasks assessed     SENSATION: Patient reports no numbness or tingling  POSTURE: Forward head  UPPER EXTREMITY ROM:   Active ROM Right eval Left eval  Shoulder flexion 136 106; limited by pain   Shoulder extension    Shoulder abduction 146 106; limited by pain  Shoulder adduction    Shoulder internal rotation To L2 To left gluteals  Shoulder external rotation To T2 To C7  Elbow flexion    Elbow extension    Wrist flexion    Wrist extension    Wrist ulnar deviation    Wrist radial deviation    Wrist pronation    Wrist supination    (Blank rows = not tested)  UPPER EXTREMITY MMT:  MMT Right eval Left eval  Shoulder flexion 4/5 4/5  Shoulder extension    Shoulder abduction 3+/5 3+/5; limited by pain   Shoulder adduction    Shoulder internal rotation 4+/5 4/5  Shoulder external rotation 4/5 4-/5; limited by pain  Middle trapezius    Lower trapezius    Elbow flexion  4+/5 4-/5  Elbow extension 4+/5 4/5  Wrist flexion    Wrist extension    Wrist ulnar deviation    Wrist radial deviation    Wrist pronation    Wrist supination    Grip strength (lbs) 40 40  (Blank rows = not tested)  JOINT MOBILITY TESTING:   Left glenohumeral: hypomobile and familiar pain   PALPATION:  TTP: left infraspinatus and biceps                                                                                                                             TREATMENT DATE:   08/04/23    EXERCISE LOG  Exercise Repetitions and Resistance Comments  Pulleys  6 minutes  Flexion  UBE  10 minutes @ 90 RPM   Wall ladder    Max #22  Goal Assessment    Ball on Wall 3 mins For shoulder IR  UE ranger (seated)  6 mins multidirecitonal  Multidirectional   Therabar bending   Up and down  Resisted row  Red x 30 reps   Resisted pull down  Red x 25 reps    Blank cell = exercise not performed today  Modalities: no redness or adverse reaction to today's modalities  Date:  Unattended Estim: left biceps, pre mod @ 80-150 Hz, 15 mins, Pain and Tone  06/23/23 EXERCISE LOG  Exercise Repetitions and Resistance Comments  Pulleys  5.5 minutes   UE ranger (standing)  3.5 minutes  Circles  UBE  10 minutes @ 90 RPM   Isometric ball squeeze 4 minutes w/ 5 second hold   Therabar bending Red t-bar @ 4 minutes    Blank cell = exercise not performed today  Modalities: no adverse reaction to today's modalities  Date:  Unattended Estim: left biceps , pre mod @ 80-150 Hz, 15 mins, Tone Hot Pack: Shoulder, 15 mins, Tone  06/17/23:  EXERCISE LOG  Exercise Repetitions and Resistance Comments  UBE 8 minutes   Pulleys  5 minutes   Seated UE Ranger 5 minutes   Combo e'stim/uS  at 1.50 W/CM2 x 8 minutes to patient's left biceps groove region f/b STW/M x 8 minutes including to posterior cuff f/b HMP and IFC at 80-150 Hz on 40% scan x 20 minutes.   HMP and IFC at 80-150 Hz  on 40% scan x   PATIENT EDUCATION: Education details: HEP and healing Person educated: Patient Education method: Explanation Education comprehension: verbalized understanding  HOME EXERCISE PROGRAM: Rows and extensions  ASSESSMENT:  CLINICAL IMPRESSION:   Pt arrives for today's treatment session reporting minimal left shoulder pain while at rest, but reports that pain can increase to 10/10 with movement.  Pt has MRI scheduled for 7/7 and has appt to get results on 7/21.  Pt plans to go on hold until she gets the results from her MRI to conserve visits.  Pt given HEP to perform while on hold.  Normal responses to estim noted upon removal.  Pt reported minimal pain at completion of today's treatment session.   OBJECTIVE IMPAIRMENTS: decreased activity tolerance, decreased ROM, decreased strength, hypomobility, impaired tone, impaired UE functional use, and pain.   ACTIVITY LIMITATIONS: carrying, lifting, bathing, dressing, and reach over head  PARTICIPATION LIMITATIONS: meal prep, cleaning, laundry, shopping, and community activity  PERSONAL FACTORS: Past/current experiences, Time since onset of injury/illness/exacerbation, and 3+ comorbidities: OA, current smoker, anxiety, and hypertension are also affecting patient's functional outcome.   REHAB POTENTIAL: Good  CLINICAL DECISION MAKING: Evolving/moderate complexity  EVALUATION COMPLEXITY: Moderate   GOALS: Goals reviewed with patient? Yes  SHORT TERM GOALS: Target date: 06/07/23  Patient will be independent with initial HEP. Baseline:I do some every day  Goal status: MET  2.  Patient will be able to complete her daily activities without her familiar left shoulder pain exceeding 7/10. Baseline:  Goal status: IN PROGRESS  3.  Patient will be able to demonstrate at least 118 degrees of left shoulder flexion for improved functional reaching overhead. Baseline: 6/18: 118 degrees Goal status: MET  4.  Patient will be able to  demonstrate at least 118 degrees of active left shoulder abduction for improved functional reaching overhead Baseline: 6/18: 120 degrees Goal status: MET  5.  Patient will improve her QuickDASH score to 44 or less for improved perceived function with her daily activities. Baseline: 56% Goal status: IN PROGRESS  LONG TERM GOALS: Target date: 06/28/23  Patient will be independent with her advanced HEP. Baseline:  Goal status: ON GOING  2.  Patient will be able to lift at least 5 pounds with her left upper extremity overhead for improved function putting away her dishes. Baseline: 6/18: able to perform with discomfort Goal status: PARTIALLY MET  3.  Patient will be able to demonstrate at least 130 degrees of active left shoulder flexion for improved function with overhead activities.  Baseline: see short term goals Goal status: IN PROGRESS  4.  Patient will report being able to wash her back with her left upper extremity for improved functional mobility. Baseline:  Goal status: IN PROGRESS  5.  Patient will improve her QuickDASH score to 34 or less for improved perceived function with her daily activities. Baseline:  Goal status: IN PROGRESS  PLAN:  PT FREQUENCY: 2x/week  PT DURATION: 6 weeks  PLANNED INTERVENTIONS: 97164- PT Re-evaluation, 97750- Physical Performance Testing, 97110-Therapeutic exercises, 97530- Therapeutic activity, [id number]- Neuromuscular re-education, 97535- Self Care, [id number]- Manual therapy, [id number]- Electrical stimulation (unattended), 97016- Vasopneumatic device, Patient/Family education, Taping, Dry Needling, Joint mobilization, Cryotherapy, and Moist heat  PLAN FOR NEXT SESSION: pulleys, isometrics, manual therapy, PROM, and modalities as needed    Delon DELENA Gosling, PTA 08/04/2023, 4:00 PM

## 2023-08-07 IMAGING — MG MM DIGITAL SCREENING BILAT W/ TOMO AND CAD
8 of 14 series · 8 of 40 positions shown · non-contrast
Comparison: Previous exam(s).

ACR Breast Density Category a: The breast tissue is almost entirely
fatty.

CLINICAL DATA: Screening.

EXAM:
DIGITAL SCREENING BILATERAL MAMMOGRAM WITH TOMOSYNTHESIS AND CAD
TECHNIQUE: Bilateral screening digital craniocaudal and mediolateral oblique
mammograms were obtained. Bilateral screening digital breast
tomosynthesis was performed. The images were evaluated with
computer-aided detection.

[L MLO synth-2D (1 of 2)]
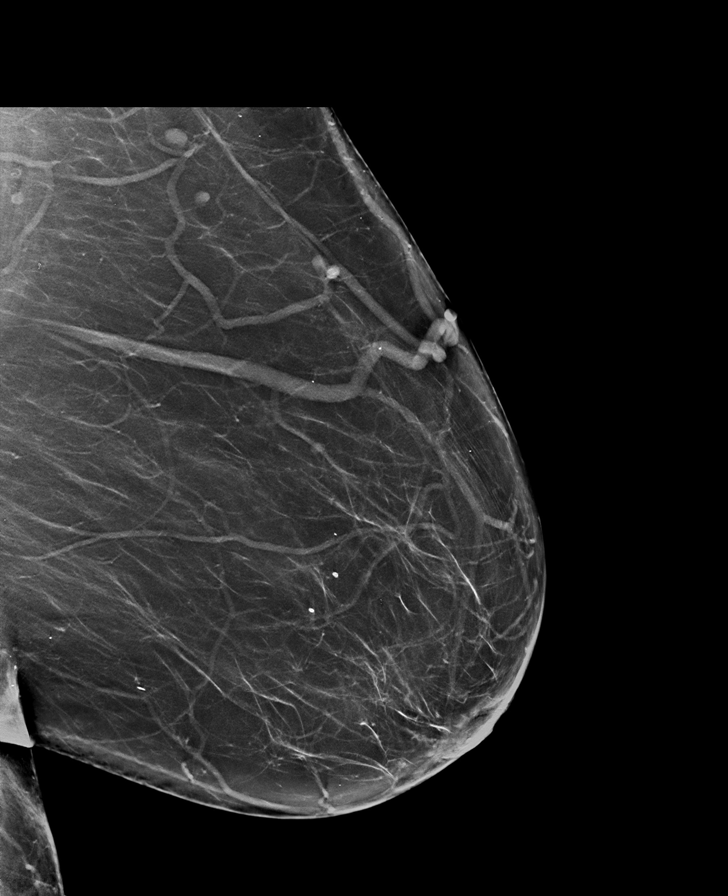

[R MLO synth-2D (1 of 2)]
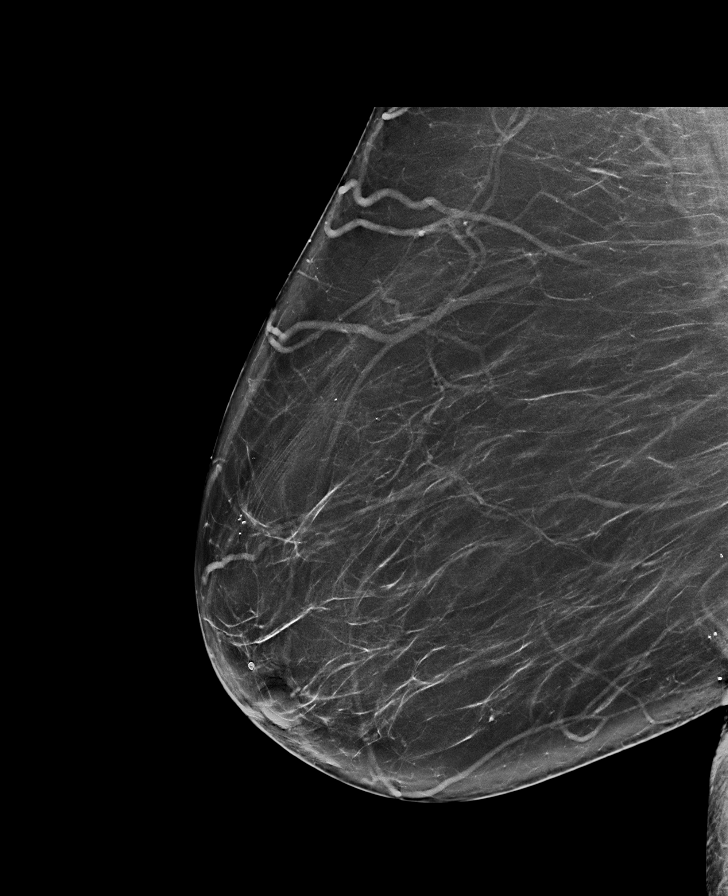

[L MLO synth-2D (2 of 2)]
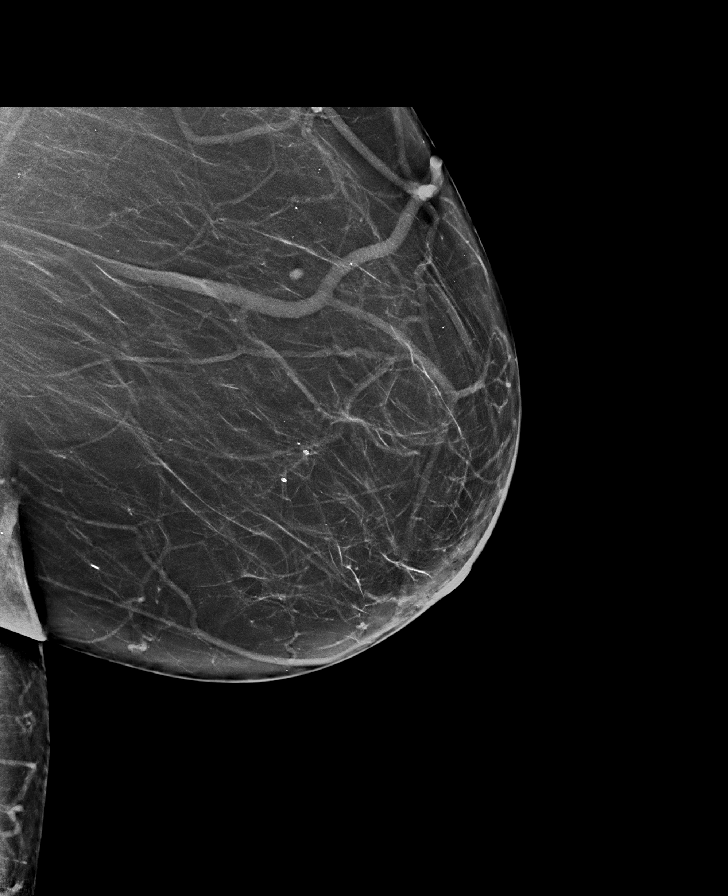

[L CC synth-2D (1 of 2)]
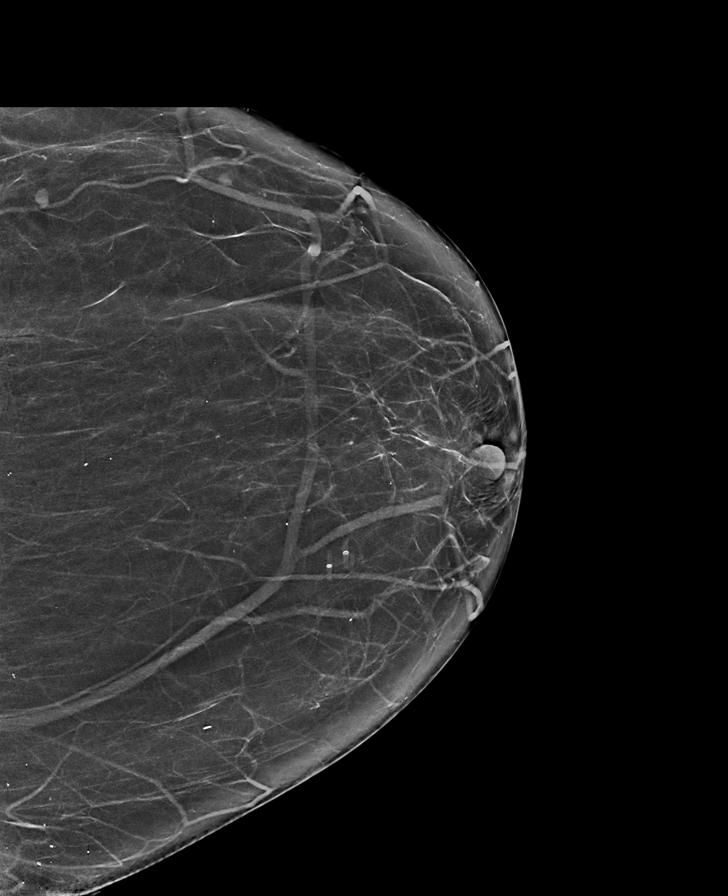

[L CC synth-2D (2 of 2)]
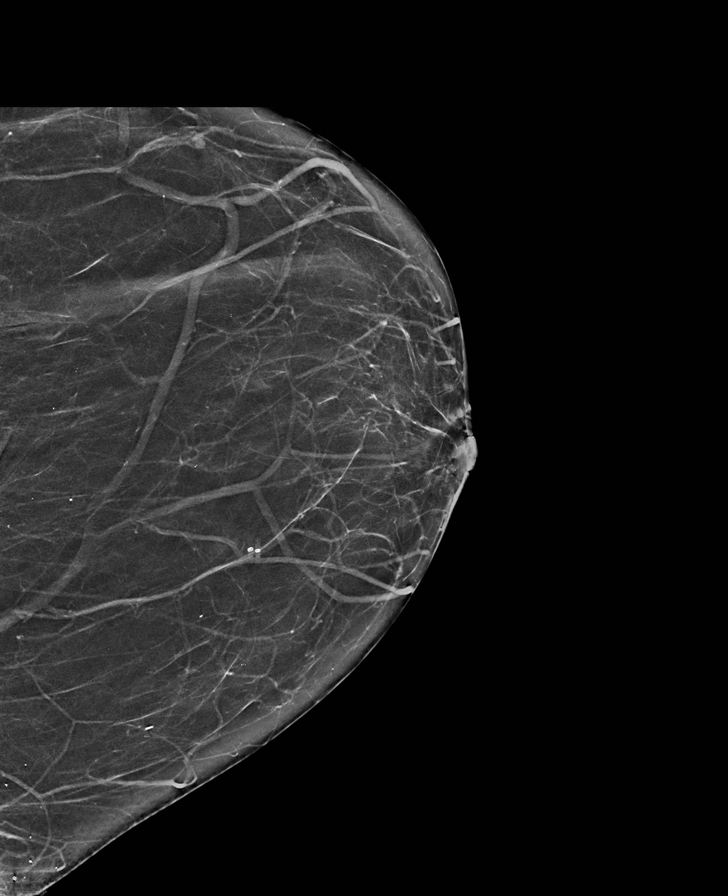

[R CC synth-2D]
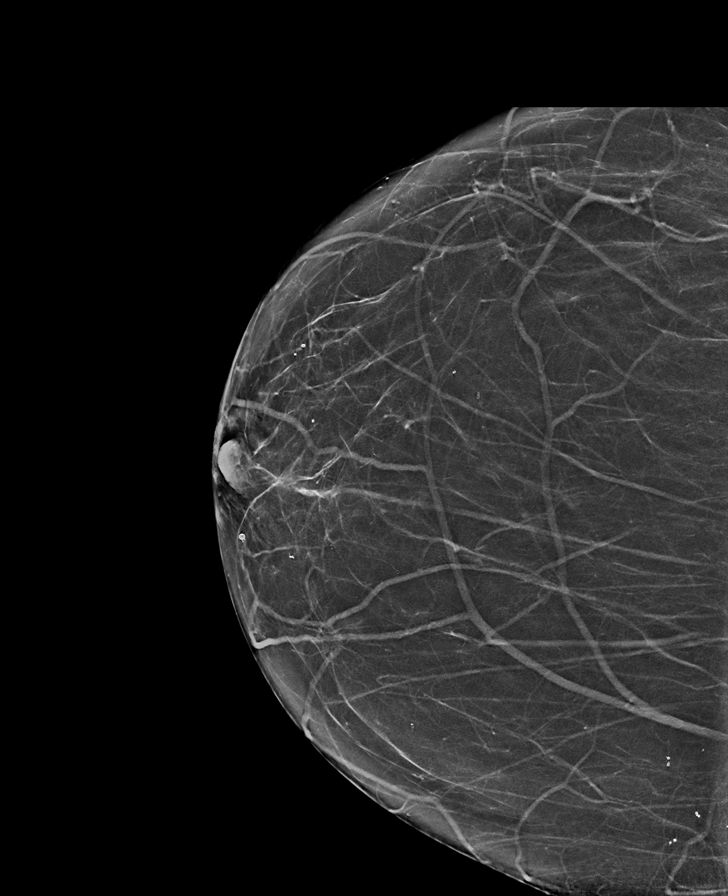

[R MLO synth-2D (2 of 2)]
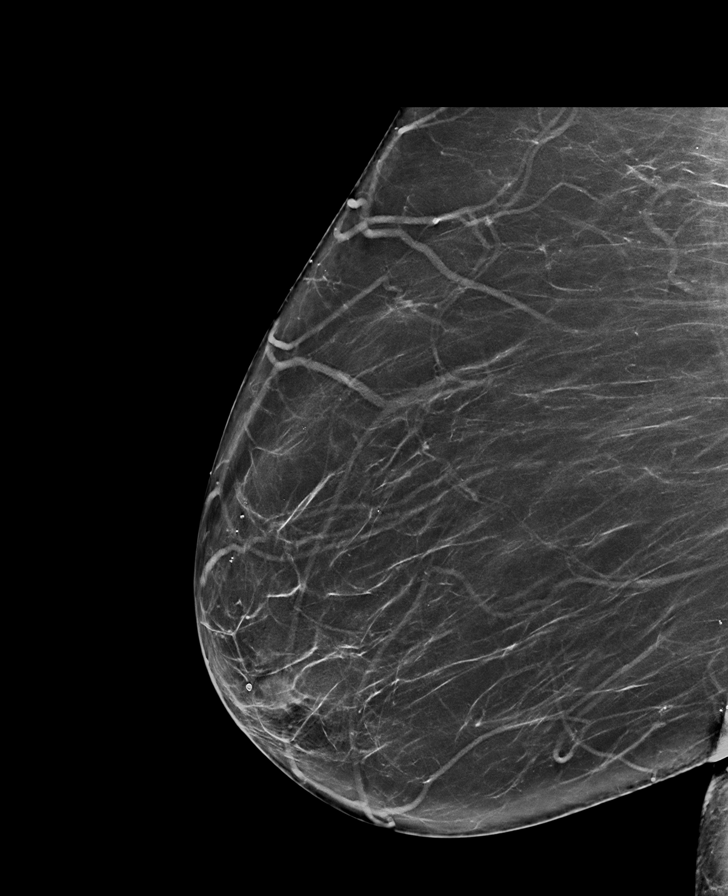

[R CC tomo · tomo slice 33/66.0]
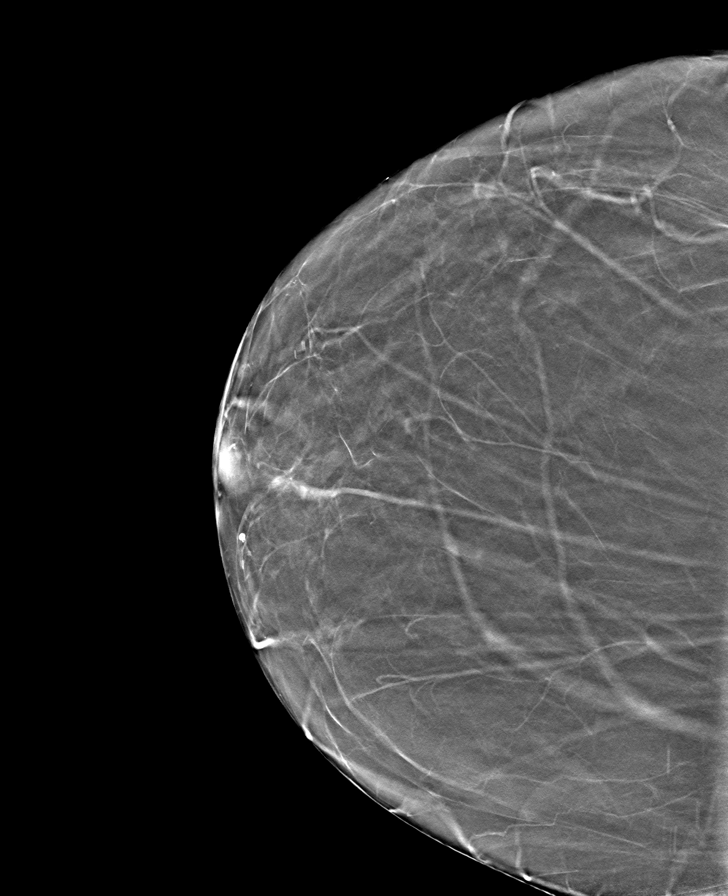

[8 of 40 positions shown; findings below may reference images not displayed]

FINDINGS: There are no findings suspicious for malignancy.
IMPRESSION: No mammographic evidence of malignancy. A result letter of this
screening mammogram will be mailed directly to the patient.

RECOMMENDATION:
Screening mammogram in one year. (Code:0E-3-N98)

BI-RADS CATEGORY  1: Negative.

## 2023-08-09 ENCOUNTER — Ambulatory Visit (HOSPITAL_COMMUNITY)
Admission: RE | Admit: 2023-08-09 | Discharge: 2023-08-09 | Disposition: A | Discharge status: Home / Self Care | Source: Ambulatory Visit | Attending: Orthopedic Surgery | Admitting: Orthopedic Surgery

## 2023-08-09 DIAGNOSIS — G8929 Other chronic pain: Secondary | ICD-10-CM | POA: Diagnosis present

## 2023-08-09 DIAGNOSIS — M25512 Pain in left shoulder: Secondary | ICD-10-CM | POA: Diagnosis present

## 2023-08-23 ENCOUNTER — Ambulatory Visit: Admitting: Orthopedic Surgery

## 2023-08-23 DIAGNOSIS — M7542 Impingement syndrome of left shoulder: Secondary | ICD-10-CM

## 2023-08-23 MED ORDER — PREDNISONE 10 MG (48) PO TBPK: ORAL_TABLET | Freq: Every day | ORAL | 0 refills | Status: DC

## 2023-08-23 MED ORDER — METHYLPREDNISOLONE ACETATE 40 MG/ML IJ SUSP
40.0000 mg | Freq: Once | INTRAMUSCULAR | Status: AC
  Administered 2023-08-23: 40 mg via INTRA_ARTICULAR

## 2023-08-23 NOTE — Progress Notes (Signed)
   There were no vitals taken for this visit.  There is no height or weight on file to calculate BMI.  Chief Complaint  Patient presents with   Results    Left Shoulder    No diagnosis found.  DOI/DOS/ Date: 08/23/23  No Better

## 2023-08-23 NOTE — Progress Notes (Signed)
  Subjective:     Patient ID: Caitlin Bond, female   DOB: 09/07/1960, 63 y.o.   MRN: [id number]  Chief Complaint  Patient presents with   Results    Left Shoulder pain, MRI results    HPI we are now into the 6th or 7th month where the patient has pain in her left shoulder severe at times.  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   Impingement syndrome of left shoulder Yes    After reviewing her MRI not finding a tear it is unclear why the pain is so severe.  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  She does have glenohumeral arthritis, AC there is the finding of calcific tendinitis in the subscapularis on the report I do not really see but I do not think that is causing her pain anyway      Plan:     I am going to repeat her injection  Recheck the shoulder in 6 weeks  If she still not any better then cervical spine potential pathology that would contribute to severe shoulder pain  In the meantime she will continue therapy which was placed on hold until MRI was done     Procedure note the subacromial injection shoulder left   Verbal consent was obtained to inject the  Left   Shoulder  Timeout was completed to confirm the injection site is a subacromial space of the  left  shoulder  Medication used Depo-Medrol  40 mg and lidocaine  1% 3  cc  Anesthesia was provided by ethyl chloride  The injection was performed in the left  posterior subacromial space. After pinning the skin with alcohol and anesthetized the skin with ethyl chloride the subacromial space was injected using a 20-gauge needle. There were no complications  Sterile dressing was applied.   Steroid Dosepak 12 days double strength 10 mg as directed

## 2023-09-08 ENCOUNTER — Telehealth: Payer: Self-pay | Admitting: Orthopedic Surgery

## 2023-09-08 NOTE — Telephone Encounter (Signed)
 Dr. Areatha pt - pt lvm stating the Prednisone  has caused her to have thrush, she would like something sent to [facility] for that.

## 2023-09-09 ENCOUNTER — Other Ambulatory Visit: Payer: Self-pay | Admitting: Orthopedic Surgery

## 2023-09-09 DIAGNOSIS — B370 Candidal stomatitis: Secondary | ICD-10-CM

## 2023-09-09 MED ORDER — NYSTATIN 100000 UNIT/ML MT SUSP: 5.0000 mL | Freq: Four times a day (QID) | OROMUCOSAL | 0 refills | Status: DC

## 2023-10-01 ENCOUNTER — Other Ambulatory Visit: Payer: Self-pay | Admitting: Orthopedic Surgery

## 2023-10-01 DIAGNOSIS — M7542 Impingement syndrome of left shoulder: Secondary | ICD-10-CM

## 2023-10-06 DIAGNOSIS — G4700 Insomnia, unspecified: Secondary | ICD-10-CM | POA: Insufficient documentation

## 2023-10-07 ENCOUNTER — Ambulatory Visit: Admitting: Orthopedic Surgery

## 2023-10-07 DIAGNOSIS — M7542 Impingement syndrome of left shoulder: Secondary | ICD-10-CM | POA: Diagnosis not present

## 2023-10-07 MED ORDER — CELECOXIB 100 MG PO CAPS: 100.0000 mg | ORAL_CAPSULE | Freq: Every day | ORAL | 0 refills | Status: DC

## 2023-10-07 NOTE — Progress Notes (Signed)
   There were no vitals taken for this visit.  There is no height or weight on file to calculate BMI.  Chief Complaint  Patient presents with   Shoulder Pain    Left     No diagnosis found.  DOI/DOS/ Date: ongoing     Improved, about 70% better   Prednisone  taper helped

## 2023-10-07 NOTE — Progress Notes (Signed)
 FOLLOW-UP OFFICE VISIT   Patient: Caitlin Bond           Date of Birth: [birth year]           MRN: [id number] Visit Date: 10/07/2023 Requested by: Renato Dorothey HERO, NP [id number] [place],  [place] [address] PCP: Renato Dorothey HERO, NP    Encounter Diagnosis  Name Primary?   Impingement syndrome of left shoulder Yes    Chief Complaint  Patient presents with   Shoulder Pain    Left     Yulanda improved with prednisone  12-day taper.  Side effects included 8 pound weight gain and increased excitability  She has noted some peripheral edema seeing primary care soon  She notes increased range of motion and decreased pain in her left shoulder and says she is 70% better  ASSESSMENT AND PLAN Is due to stop taking physical therapy and save the visits for when she is having more pain  Advised her to start Celebrex  100 mg daily.  Follow-up in 6 weeks  Meds ordered this encounter  Medications   celecoxib  (CELEBREX ) 100 MG capsule    Sig: Take 1 capsule (100 mg total) by mouth daily.    Dispense:  30 capsule    Refill:  0

## 2023-11-17 DIAGNOSIS — K219 Gastro-esophageal reflux disease without esophagitis: Secondary | ICD-10-CM | POA: Insufficient documentation

## 2023-11-18 ENCOUNTER — Ambulatory Visit: Admitting: Orthopedic Surgery

## 2023-11-18 ENCOUNTER — Encounter: Payer: Self-pay | Admitting: Orthopedic Surgery

## 2023-11-18 DIAGNOSIS — M7542 Impingement syndrome of left shoulder: Secondary | ICD-10-CM | POA: Diagnosis not present

## 2023-11-18 NOTE — Progress Notes (Signed)
    11/18/2023   Chief Complaint  Patient presents with   Shoulder Pain    Left     No diagnosis found.  What pharmacy do you use ? _______[facility] ____________________  DOI/DOS/ Date:    Unchanged  Was better, then went on Celebrex  and it hasn't been helpful

## 2023-11-18 NOTE — Progress Notes (Signed)
    11/18/2023   Chief Complaint  Patient presents with   Shoulder Pain    Left     Encounter Diagnosis  Name Primary?   Impingement syndrome of left shoulder Yes    What pharmacy do you use ? _______[facility] ____________________  DOI/DOS/ Date:    Unchanged  Was better, then went on Celebrex  and it hasn't been helpful   Caitlin Bond was placed on Celebrex  to try to alleviate some of the residual pain she was having in her left shoulder.  She still has some pain over the deltoid she has good strength in abduction as well as flexion in the scapular plane her range of motion is good her strength is good  Her MRI showed AC joint arthritis subdeltoid bursitis supraspinatus tendinopathy infraspinatus tendinopathy calcific tendinitis mild to moderate degenerative chondral thinning glenohumeral joint  Since her tenderness is noted at the deltoid insertion this is probably overworking of the deltoid  Recommend Thera-Band exercises recheck 6 weeks

## 2024-01-06 ENCOUNTER — Ambulatory Visit: Admitting: Orthopedic Surgery

## 2024-01-20 ENCOUNTER — Ambulatory Visit: Admitting: Orthopedic Surgery

## 2024-02-14 DIAGNOSIS — M5430 Sciatica, unspecified side: Secondary | ICD-10-CM | POA: Insufficient documentation

## 2024-02-14 DIAGNOSIS — E119 Type 2 diabetes mellitus without complications: Secondary | ICD-10-CM | POA: Insufficient documentation

## 2024-02-14 DIAGNOSIS — E782 Mixed hyperlipidemia: Secondary | ICD-10-CM | POA: Insufficient documentation

## 2024-02-14 DIAGNOSIS — E876 Hypokalemia: Secondary | ICD-10-CM | POA: Insufficient documentation

## 2024-02-14 DIAGNOSIS — F419 Anxiety disorder, unspecified: Secondary | ICD-10-CM | POA: Insufficient documentation

## 2024-02-14 DIAGNOSIS — M62830 Muscle spasm of back: Secondary | ICD-10-CM | POA: Insufficient documentation

## 2024-02-14 DIAGNOSIS — Z720 Tobacco use: Secondary | ICD-10-CM | POA: Insufficient documentation

## 2024-02-14 DIAGNOSIS — F410 Panic disorder [episodic paroxysmal anxiety] without agoraphobia: Secondary | ICD-10-CM | POA: Insufficient documentation

## 2024-02-14 DIAGNOSIS — G9332 Myalgic encephalomyelitis/chronic fatigue syndrome: Secondary | ICD-10-CM | POA: Insufficient documentation

## 2024-02-14 DIAGNOSIS — R7982 Elevated C-reactive protein (CRP): Secondary | ICD-10-CM | POA: Insufficient documentation

## 2024-02-17 ENCOUNTER — Encounter: Payer: Self-pay | Admitting: Orthopedic Surgery

## 2024-02-17 ENCOUNTER — Ambulatory Visit: Admitting: Orthopedic Surgery

## 2024-02-17 DIAGNOSIS — M7542 Impingement syndrome of left shoulder: Secondary | ICD-10-CM | POA: Diagnosis not present

## 2024-02-17 DIAGNOSIS — G8929 Other chronic pain: Secondary | ICD-10-CM | POA: Diagnosis not present

## 2024-02-17 DIAGNOSIS — M25512 Pain in left shoulder: Secondary | ICD-10-CM | POA: Diagnosis not present

## 2024-02-17 NOTE — Progress Notes (Signed)
" ° ° °  02/17/2024   Chief Complaint  Patient presents with   Shoulder Pain    No diagnosis found.  What pharmacy do you use ? _[facility]_______  DOI/DOS/ Date:    Did you get better, worse or no change (Answer below)   Unchanged      "

## 2024-02-17 NOTE — Progress Notes (Signed)
" ° °  Chief Complaint  Patient presents with   Shoulder Pain    left    Hazelene is 64 years old she reports no change in her shoulder after trying Thera-Band exercises  She has had injection in the shoulder as well as medication for the pain.  She has been able to get to a functional level with increased range of motion and decreased pain  She says it is sore but the pain is tolerable    Shoulder Pain     PHYSICAL EXAM:   In abduction she has 5 out of 5 strength she has 150 degrees of flexion she has decreased internal rotation to approximately the sacrum on the left side  Assessment and Plan:   Encounter Diagnoses  Name Primary?   Impingement syndrome of left shoulder Yes   Chronic left shoulder pain     Patient has decided to not undergo any other therapeutic interventions and to live with the symptoms that she has IMPRESSION: 1. 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 Mild to moderate supraspinatus tendinopathy and mild infraspinatus tendinopathy. 2. Moderate degenerative spurring at the acromioclavicular joint. Trace subacromial subdeltoid bursitis. 3. Mild to moderate degenerative chondral thinning in the glenohumeral joint. 4. Red marrow redistribution is present. This is nonspecific can be associated with a variety of conditions including chronic anemia, chronic heart failure, myelofibrosis, infiltrative marrow disease, response to infection, and other conditions.     Electronically Signed   By: Ryan Salvage M.D.   On: 08/10/2023 10:45 "
